# Patient Record
Sex: Male | Born: 1975 | Hispanic: Yes | Marital: Married | Smoking: Never smoker
Health system: Southern US, Community
[De-identification: ages and names within clinical notes are randomized; demographics above are authoritative.]

## PROBLEM LIST (undated history)

## (undated) DIAGNOSIS — J40 Bronchitis, not specified as acute or chronic: Secondary | ICD-10-CM

## (undated) DIAGNOSIS — J302 Other seasonal allergic rhinitis: Secondary | ICD-10-CM

## (undated) DIAGNOSIS — J45909 Unspecified asthma, uncomplicated: Secondary | ICD-10-CM

## (undated) HISTORY — PX: APPENDECTOMY: SHX54

## (undated) HISTORY — DX: Unspecified asthma, uncomplicated: J45.909

---

## 1998-03-24 ENCOUNTER — Inpatient Hospital Stay (HOSPITAL_COMMUNITY): Admission: EM | Admit: 1998-03-24 | Discharge: 1998-03-26 | Payer: Self-pay | Admitting: Emergency Medicine

## 2000-12-13 ENCOUNTER — Encounter: Payer: Self-pay | Admitting: Emergency Medicine

## 2000-12-13 ENCOUNTER — Emergency Department (HOSPITAL_COMMUNITY): Admission: EM | Admit: 2000-12-13 | Discharge: 2000-12-13 | Payer: Self-pay | Admitting: *Deleted

## 2000-12-21 ENCOUNTER — Emergency Department (HOSPITAL_COMMUNITY): Admission: EM | Admit: 2000-12-21 | Discharge: 2000-12-21 | Payer: Self-pay | Admitting: Emergency Medicine

## 2008-02-02 ENCOUNTER — Ambulatory Visit: Payer: Self-pay | Admitting: Internal Medicine

## 2008-02-02 DIAGNOSIS — J309 Allergic rhinitis, unspecified: Secondary | ICD-10-CM | POA: Insufficient documentation

## 2011-07-18 ENCOUNTER — Ambulatory Visit: Payer: Self-pay | Admitting: Family Medicine

## 2011-07-18 VITALS — BP 121/75 | HR 69 | Temp 98.1°F | Resp 16 | Ht 68.0 in | Wt 194.0 lb

## 2011-07-18 DIAGNOSIS — R079 Chest pain, unspecified: Secondary | ICD-10-CM

## 2011-07-18 DIAGNOSIS — R0789 Other chest pain: Secondary | ICD-10-CM

## 2011-07-18 NOTE — Progress Notes (Signed)
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  The pain is mild and only significant with sneeze, not deep breath or cough.  No SOB.    O:  NAD Chest clear with ecchymosis x 4 cm right axilla.  Minimal tenderness. No other signs of trauma Heart:  Reg, no murmur  A:  Contusion right chest  P:  meloxicam 7.5 qd x 10 days.  rtc for worsening sx

## 2013-06-22 ENCOUNTER — Ambulatory Visit: Payer: Self-pay | Admitting: Family Medicine

## 2013-06-22 VITALS — BP 120/72 | HR 80 | Temp 97.5°F | Resp 18 | Ht 67.5 in | Wt 198.0 lb

## 2013-06-22 DIAGNOSIS — R112 Nausea with vomiting, unspecified: Secondary | ICD-10-CM

## 2013-06-22 DIAGNOSIS — K529 Noninfective gastroenteritis and colitis, unspecified: Secondary | ICD-10-CM

## 2013-06-22 DIAGNOSIS — R197 Diarrhea, unspecified: Secondary | ICD-10-CM

## 2013-06-22 DIAGNOSIS — K5289 Other specified noninfective gastroenteritis and colitis: Secondary | ICD-10-CM

## 2013-06-22 MED ORDER — ONDANSETRON 4 MG PO TBDP: 4.0000 mg | ORAL_TABLET | Freq: Three times a day (TID) | ORAL | Status: DC | PRN

## 2013-06-22 NOTE — Patient Instructions (Signed)
Drink plenty of fluids  Take Zofran one pill every 4-6 hours if needed for nausea and vomiting. You do not need to swallow the pill, and can just hold it in your mouth under the tongue until it dissolves.  Take over-the-counter Imodium (loperamide) as directed on the box if you continue having diarrhea  If you develop severe abdominal pain or high fevers either return or go to the emergency room  Take Tylenol (acetaminophen) or ibuprofen if needed for headache  This is not the typical influenza. I think this is an intestinal virus.  Gastroenteritis viral (Viral Gastroenteritis) La gastroenteritis viral tambin es conocida como gripe del Endicottestmago. Este trastorno Performance Food Groupafecta el estmago y el tubo digestivo. Puede causar diarrea y vmitos repentinos. La enfermedad generalmente dura entre 3 y [address] das. La Harley-Davidsonmayora de las personas desarrolla una respuesta inmunolgica. Con el tiempo, esto elimina el virus. Mientras se desarrolla esta respuesta natural, el virus puede afectar en forma importante su salud.  CAUSAS Muchos virus diferentes pueden causar gastroenteritis, por ejemplo el rotavirus o el norovirus. Estos virus pueden contagiarse al consumir alimentos o agua contaminados. Tambin puede contagiarse al compartir utensilios u otros artculos personales con una persona infectada o al tocar una superficie contaminada.  SNTOMAS Los sntomas ms comunes son diarrea y vmitos. Estos problemas pueden causar una prdida grave de lquidos corporales(deshidratacin) y un desequilibrio de sales corporales(electrolitos). Otros sntomas pueden ser:   Grant RutsFiebre.  Dolor de Turkmenistancabeza.  Fatiga.  Dolor abdominal. DIAGNSTICO  El mdico podr hacer el diagnstico de gastroenteritis viral basndose en los sntomas y el examen fsico Tambin pueden tomarle una muestra de materia fecal para diagnosticar la presencia de virus u otras infecciones.  TRATAMIENTO Esta enfermedad generalmente desaparece sin tratamiento. Los  tratamientos estn dirigidos a Social research officer, governmentla rehidratacin. Los casos ms graves de gastroenteritis viral implican vmitos tan intensos que no es posible retener lquidos. En Franklin Resourcesestos casos, los lquidos deben administrarse a travs de una va intravenosa (IV).  INSTRUCCIONES PARA EL CUIDADO DOMICILIARIO  Beba suficientes lquidos para mantener la orina clara o de color amarillo plido. Beba pequeas cantidades de lquido con frecuencia y aumente la cantidad segn la tolerancia.  Pida instrucciones especficas a su mdico con respecto a la rehidratacin.  Evite:  Alimentos que Nurse, adulttengan mucha azcar.  Alcohol.  Gaseosas.  TabacoVista Lawman.  Jugos.  Bebidas con cafena.  Lquidos muy calientes o fros.  Alimentos muy grasos.  Comer demasiado a Licensed conveyancerla vez.  Productos lcteos hasta 24 a 48 horas despus de que se detenga la diarrea.  Puede consumir probiticos. Los probiticos son cultivos activos de bacterias beneficiosas. Pueden disminuir la cantidad y el nmero de deposiciones diarreicas en el adulto. Se encuentran en los yogures con cultivos activos y en los suplementos.  Lave bien sus manos para evitar que se disemine el virus.  Slo tome medicamentos de venta libre o recetados para Primary school teachercalmar el dolor, las molestias o bajar la fiebre segn las indicaciones de su mdico. No administre aspirina a los nios. Los medicamentos antidiarreicos no son recomendables.  Consulte a su mdico si puede seguir tomando sus medicamentos recetados o de H. J. Heinzventa libre.  Cumpla con todas las visitas de control, segn le indique su mdico. SOLICITE ATENCIN MDICA DE INMEDIATO SI:  No puede retener lquidos.  No hay emisin de orina durante 6 a 8 horas.  Le falta el aire.  Observa sangre en el vmito (se ve como caf molido) o en la materia fecal.  Siente dolor abdominal que empeora o se  concentra en una zona pequea (se localiza).  Tiene nuseas o vmitos persistentes.  Tiene fiebre.  El paciente es un nio menor de  3 meses y [place].  El paciente es un nio mayor de 3 meses, tiene fiebre y sntomas persistentes.  El paciente es un nio mayor de 3 meses y tiene fiebre y sntomas que empeoran repentinamente.  El paciente es un beb y no tiene lgrimas cuando llora. ASEGRESE QUE:   Comprende estas instrucciones.  Controlar su enfermedad.  Solicitar ayuda inmediatamente si no mejora o si empeora. Document Released: 04/23/2005 Document Revised: 07/16/2011 [facility] Patient Information 2014 [place], [place].

## 2013-06-22 NOTE — Progress Notes (Signed)
Subjective: The patient woke up this morning with a headache and nausea and has had diarrhea. He vomited a number of times. He feels bad and washed out. No major abdominal pain. No fever.  Objective: In no acute distress. He looks a little ill. His throat is clear. Neck supple without nodes. Chest clear. Heart regular without murmurs. And soft without masses or tenderness.  Assessment: Gastroenteritis Vomiting Nausea Diarrhea  Plan: Zofran Imodium if needed Return if needed or go to emergency room in the event of acute abdominal pain

## 2014-03-19 ENCOUNTER — Encounter (HOSPITAL_COMMUNITY): Payer: Self-pay | Admitting: Family Medicine

## 2014-03-19 ENCOUNTER — Emergency Department (HOSPITAL_COMMUNITY): Payer: Self-pay

## 2014-03-19 ENCOUNTER — Emergency Department (HOSPITAL_COMMUNITY)
Admission: EM | Admit: 2014-03-19 | Discharge: 2014-03-19 | Disposition: A | Discharge status: Home / Self Care | Payer: Self-pay | Attending: Emergency Medicine | Admitting: Emergency Medicine

## 2014-03-19 DIAGNOSIS — R0682 Tachypnea, not elsewhere classified: Secondary | ICD-10-CM | POA: Insufficient documentation

## 2014-03-19 DIAGNOSIS — R05 Cough: Secondary | ICD-10-CM

## 2014-03-19 DIAGNOSIS — J209 Acute bronchitis, unspecified: Secondary | ICD-10-CM | POA: Insufficient documentation

## 2014-03-19 DIAGNOSIS — Z79899 Other long term (current) drug therapy: Secondary | ICD-10-CM | POA: Insufficient documentation

## 2014-03-19 DIAGNOSIS — R0602 Shortness of breath: Secondary | ICD-10-CM

## 2014-03-19 DIAGNOSIS — J40 Bronchitis, not specified as acute or chronic: Secondary | ICD-10-CM

## 2014-03-19 DIAGNOSIS — R059 Cough, unspecified: Secondary | ICD-10-CM

## 2014-03-19 LAB — BASIC METABOLIC PANEL
Anion gap: 14 (ref 5–15)
BUN: 10 mg/dL (ref 6–23)
CO2: 22 meq/L (ref 19–32)
Calcium: 9 mg/dL (ref 8.4–10.5)
Chloride: 104 mEq/L (ref 96–112)
Creatinine, Ser: 0.92 mg/dL (ref 0.50–1.35)
GFR calc non Af Amer: >90 mL/min (ref >90)
GLUCOSE: 121 mg/dL — AB (ref 70–99)
POTASSIUM: 3.7 meq/L (ref 3.7–5.3)
Sodium: 140 mEq/L (ref 137–147)

## 2014-03-19 LAB — CBC WITH DIFFERENTIAL/PLATELET
Basophils Absolute: 0.1 10*3/uL (ref 0.0–0.1)
Basophils Relative: 1 % (ref 0–1)
Eosinophils Absolute: 1.9 10*3/uL — ABNORMAL HIGH (ref 0.0–0.7)
Eosinophils Relative: 14 % — ABNORMAL HIGH (ref 0–5)
HEMATOCRIT: 45.2 % (ref 39.0–52.0)
HEMOGLOBIN: 15.8 g/dL (ref 13.0–17.0)
LYMPHS ABS: 2.7 10*3/uL (ref 0.7–4.0)
Lymphocytes Relative: 21 % (ref 12–46)
MCH: 29.8 pg (ref 26.0–34.0)
MCHC: 35 g/dL (ref 30.0–36.0)
MCV: 85.1 fL (ref 78.0–100.0)
MONO ABS: 0.7 10*3/uL (ref 0.1–1.0)
MONOS PCT: 5 % (ref 3–12)
NEUTROS ABS: 7.9 10*3/uL — AB (ref 1.7–7.7)
Neutrophils Relative %: 59 % (ref 43–77)
Platelets: 239 10*3/uL (ref 150–400)
RBC: 5.31 MIL/uL (ref 4.22–5.81)
RDW: 12.9 % (ref 11.5–15.5)
WBC: 13.3 10*3/uL — ABNORMAL HIGH (ref 4.0–10.5)

## 2014-03-19 MED ORDER — ALBUTEROL SULFATE HFA 108 (90 BASE) MCG/ACT IN AERS
6.0000 | INHALATION_SPRAY | Freq: Once | RESPIRATORY_TRACT | Status: AC
  Administered 2014-03-19: 6 via RESPIRATORY_TRACT
  Filled 2014-03-19: qty 6.7

## 2014-03-19 MED ORDER — ACETAMINOPHEN 500 MG PO TABS
1000.0000 mg | ORAL_TABLET | Freq: Once | ORAL | Status: AC
  Administered 2014-03-19: 1000 mg via ORAL
  Filled 2014-03-19: qty 2

## 2014-03-19 MED ORDER — ALBUTEROL (5 MG/ML) CONTINUOUS INHALATION SOLN
10.0000 mg/h | INHALATION_SOLUTION | Freq: Once | RESPIRATORY_TRACT | Status: AC
  Administered 2014-03-19: 10 mg/h via RESPIRATORY_TRACT
  Filled 2014-03-19: qty 20

## 2014-03-19 MED ORDER — SODIUM CHLORIDE 0.9 % IV BOLUS (SEPSIS)
1000.0000 mL | Freq: Once | INTRAVENOUS | Status: AC
  Administered 2014-03-19: 1000 mL via INTRAVENOUS

## 2014-03-19 MED ORDER — ALBUTEROL SULFATE (2.5 MG/3ML) 0.083% IN NEBU
5.0000 mg | INHALATION_SOLUTION | Freq: Once | RESPIRATORY_TRACT | Status: AC
  Administered 2014-03-19: 5 mg via RESPIRATORY_TRACT
  Filled 2014-03-19: qty 6

## 2014-03-19 MED ORDER — ALBUTEROL SULFATE HFA 108 (90 BASE) MCG/ACT IN AERS: 4.0000 | INHALATION_SPRAY | Freq: Four times a day (QID) | RESPIRATORY_TRACT | Status: DC | PRN

## 2014-03-19 MED ORDER — DEXAMETHASONE 4 MG PO TABS: 10.0000 mg | ORAL_TABLET | Freq: Once | ORAL | Status: DC

## 2014-03-19 MED ORDER — ALBUTEROL SULFATE (2.5 MG/3ML) 0.083% IN NEBU: 5.0000 mg | INHALATION_SOLUTION | Freq: Once | RESPIRATORY_TRACT | Status: AC

## 2014-03-19 MED ORDER — IPRATROPIUM-ALBUTEROL 0.5-2.5 (3) MG/3ML IN SOLN
3.0000 mL | Freq: Once | RESPIRATORY_TRACT | Status: AC
  Administered 2014-03-19: 3 mL via RESPIRATORY_TRACT
  Filled 2014-03-19: qty 3

## 2014-03-19 NOTE — ED Provider Notes (Signed)
CSN: [id number]     Arrival date & time 03/19/14  1530 History   First MD Initiated Contact with Patient 03/19/14 1728     Chief Complaint  Patient presents with  . Shortness of Breath     (Consider location/radiation/quality/duration/timing/severity/associated sxs/prior Treatment) Patient is a 38 y.o. male presenting with shortness of breath. The history is provided by the patient. No language interpreter was used.  Shortness of Breath Severity:  Moderate Onset quality:  Gradual Duration:  1 week Timing:  Constant Progression:  Worsening Chronicity:  Recurrent Context: URI   Relieved by:  Inhaler Worsened by:  Coughing Ineffective treatments:  None tried Associated symptoms: cough   Associated symptoms: no abdominal pain, no chest pain, no fever, no headaches, no rash, no sore throat and no vomiting   Risk factors: no family hx of DVT, no hx of PE/DVT, no prolonged immobilization and no tobacco use     History reviewed. No pertinent past medical history. Past Surgical History  Procedure Laterality Date  . Appendectomy     Family History  Problem Relation Age of Onset  . Diabetes Mother   . Diabetes Father    History  Substance Use Topics  . Smoking status: Passive Smoke Exposure - Never Smoker    Types: Cigarettes  . Smokeless tobacco: Not on file  . Alcohol Use: Not on file    Review of Systems  Constitutional: Negative for fever.  HENT: Negative for congestion, rhinorrhea and sore throat.   Respiratory: Positive for cough and shortness of breath.   Cardiovascular: Negative for chest pain.  Gastrointestinal: Negative for nausea, vomiting, abdominal pain and diarrhea.  Genitourinary: Negative for dysuria and hematuria.  Skin: Negative for rash.  Neurological: Negative for syncope, light-headedness and headaches.  All other systems reviewed and are negative.     Allergies  Review of patient's allergies indicates no known allergies.  Home Medications    Prior to Admission medications   Medication Sig Start Date End Date Taking? Authorizing Provider  albuterol (PROVENTIL) (2.5 MG/3ML) 0.083% nebulizer solution Take 2.5 mg by nebulization every 6 (six) hours as needed for wheezing or shortness of breath.  03/14/14  Yes Historical Provider, MD  ibuprofen (ADVIL,MOTRIN) 200 MG tablet Take 200 mg by mouth every 6 (six) hours as needed for headache or mild pain.    Yes Historical Provider, MD  PROAIR HFA 108 (90 BASE) MCG/ACT inhaler Inhale 2 puffs into the lungs every 6 (six) hours as needed for wheezing or shortness of breath.  03/05/14  Yes Historical Provider, MD  amoxicillin-clavulanate (AUGMENTIN) 875-125 MG per tablet Take 1 tablet by mouth 2 (two) times daily. 03/03/14   Historical Provider, MD  ondansetron (ZOFRAN ODT) 4 MG disintegrating tablet Take 1 tablet (4 mg total) by mouth every 8 (eight) hours as needed for nausea or vomiting. Patient not taking: Reported on 03/19/2014 06/22/13   Peyton Najjaravid H Hopper, MD  theophylline (THEODUR) 100 MG 12 hr tablet Take 100 mg by mouth 3 (three) times daily. 03/15/14   Historical Provider, MD   BP 141/94 mmHg  Pulse 113  Temp(Src) 98 F (36.7 C) (Oral)  Resp 20  Ht 5\' 7"  (1.702 m)  Wt 195 lb (88.451 kg)  BMI 30.53 kg/m2  SpO2 100% Physical Exam  Constitutional: He is oriented to person, place, and time. He appears well-developed and well-nourished.  HENT:  Head: Normocephalic and atraumatic.  Right Ear: External ear normal.  Left Ear: External ear normal.  Eyes: EOM are  normal.  Neck: Normal range of motion. Neck supple.  Cardiovascular: Normal rate, regular rhythm and intact distal pulses.  Exam reveals no gallop and no friction rub.   No murmur heard. Pulmonary/Chest: Effort normal. No respiratory distress. He has wheezes (expiratory). He has no rales. He exhibits no tenderness.  tachypnea  Abdominal: Soft. Bowel sounds are normal. He exhibits no distension. There is no tenderness. There is no  rebound.  Musculoskeletal: Normal range of motion. He exhibits no edema or tenderness.  Lymphadenopathy:    He has no cervical adenopathy.  Neurological: He is alert and oriented to person, place, and time.  Skin: Skin is warm. No rash noted.  Psychiatric: He has a normal mood and affect. His behavior is normal.  Nursing note and vitals reviewed.   ED Course  Procedures (including critical care time) Labs Review Labs Reviewed  CBC WITH DIFFERENTIAL - Abnormal; Notable for the following:    WBC 13.3 (*)    Neutro Abs 7.9 (*)    Eosinophils Relative 14 (*)    Eosinophils Absolute 1.9 (*)    All other components within normal limits  BASIC METABOLIC PANEL - Abnormal; Notable for the following:    Glucose, Bld 121 (*)    All other components within normal limits    Imaging Review Dg Chest 2 View  03/19/2014   CLINICAL DATA:  Shortness of breath, productive cough  EXAM: CHEST  2 VIEW  COMPARISON:  None.  FINDINGS: Cardiomediastinal silhouette is unremarkable. No acute infiltrate or pleural effusion. No pulmonary edema. Bony thorax is unremarkable.  IMPRESSION: No active cardiopulmonary disease.   Electronically Signed   By: Natasha Mead M.D.   On: 03/19/2014 18:02     EKG Interpretation None      MDM   Final diagnoses:  Bronchitis  Cough    5:28 PM Pt is a 38 y.o. male with no pertinent PMHX who presents to the ED with shortness of breath. Seen at an urgent care 10/29 and diagnosed with pneumonia. Started on inhaler, augmentin, prednisone. Continues to have cough. Worsening shortness of breath over the past week. No immobilization. No previous history of TB. No sick contacts. No out of country travel. No fevers. No nausea, vomiting or diarrhea. Did not get flu shot. Endorses cough productive of yellow sputum. No history of immunosuppression. No personal or family history of asthma. Orthopnea or PND  No long distance travel. Low suspicion for DVt/PE. No chest pain, non pleuritic.  Concern for possible pneumonia versus possible bronchitis. Plan fopr CXR and albuterol and screening labs  EKG personally reviewed by myself showed sionustachycardia Rate of 113, PR , QRS 92ms QT/QTC 324/473ms, left axis, without evidence of new ischemia. No Comparison, indication: shortness of breath  Review of labs: CBc: leuikocytosis, H&H 15.8/45.2 BMP: no electrolyte abnormalities  CXR PA/LAT per my read for shortness of breath showed no focal consolidation, no ptx. After albuterol still  With tachypnea will trial duoneb.  6:51 PM still with inspiratory and expiratory wheezes after duoneb. Plan for continous albuterol  9:45PM feeling better. Scattered wheezes with improved aeration. Will ambulate and check pulse ox.   10:13 PM ambulated without desaturation. Improvement in shortness of breath. Plan for discharge. Patient likey with bronchitis. Will have RT teafch patient how to use albuterol inhaler with spacer  Patient to follow up with [place] [facility] in 1 week for follow up. Script for albuterol given. Strict return precautions given  10:29 PM:  I have discussed  the diagnosis/risks/treatment options with the patient and believe the pt to be eligible for discharge home [facility]. We also discussed returning to the ED immediately if new or worsening sx occur. We discussed the sx which are most concerning (e.g., worsening symptoms) that necessitate immediate return. Any new prescriptions provided to the patient are listed below.   Discharge Medication List as of 03/19/2014 10:29 PM      The patient appears reasonably screened and/or stabilized for discharge and I doubt any other medical condition or other [facility] requiring further screening, evaluation or treatment in the ED at this time prior to discharge . Pt in agreement with discharge plan. Return precautions given. Pt discharged VSS  Labs, EKG and imaging reviewed by myself and considered  in medical decision making if ordered.  Imaging interpreted by radiology. Pt was discussed with my attending, Dr. Elmer SowJacubowitz     Bob Eastwood Peter Steffie Waggoner, MD 03/20/14 [id number]  Doug SouSam Jacubowitz, MD 03/20/14 1444

## 2014-03-19 NOTE — ED Notes (Signed)
Pt with increased labored breathing and coughing. Dr Sherri SearJacubawitz notified.

## 2014-03-19 NOTE — ED Notes (Addendum)
Pt presents from home with c/o shortness of breath. Pt was diagnosed with Pneumonia on March 04, 2014 and was given PO antibiotics, steroids, and an inhaler. Pt reports his shortness of breath has not improved over the last few weeks.  Pt is A&Ox4. Interpreter line used to triage patient, Spanish is primary language. Pt used 2.5mg  Albuterol breathing treatment 2 hours ago.

## 2014-03-19 NOTE — ED Notes (Signed)
Respiratory called for continuous neb 

## 2014-03-19 NOTE — Discharge Instructions (Signed)
1. See [place] and wellness in 1 week for recheck 2. Come back if worsening symptoms Cmo usar Educational psychologist (How to Use an Inhaler) Es muy importante que sepa usar su [place]. Una buena tcnica garantizar que el medicamento llegue a los pulmones.  CMO USAR UN INHALADOR:  Retire la tapa del Tax inspector.  Si esta es la primera vez que [place] el [place], debe prepararlo. Sacuda el inhalador durante 5segundos. Libere cuatro descargas en el aire, lejos del rostro. Si tiene preguntas, pdale ayuda al mdico.  Sacuda el inhalador durante 5segundos.  Gire el inhalador de modo que la botella quede por encima de la boquilla.  Coloque el dedo ndice por encima de la botella. El pulgar debe sujetar la parte inferior del inhalador.  Abra la boca.  Sostenga el inhalador lejos de la boca (un ancho de 2 dedos) o coloque los labios alrededor de la boquilla. Pregntele al mdico de qu manera debe usar Forensic psychologist.  Exhale la mayor cantidad de aire que pueda.  Inhale y presione la botella hacia abajo 1vez para liberar el medicamento. Sentir cmo el medicamento ingresa a la boca y Patent examiner.  Siga inspirando profundamente, muy despacio. Trate de llenar los pulmones.  Despus de que haya inspirado profundamente, contenga la respiracin durante 10segundos. Esto ayudar a que el medicamento se asiente en los pulmones. Si no puede contener la respiracin durante 10segundos, contngala cuanto ms pueda antes de exhalar.  Exhale lentamente a travs de los labios fruncidos. Ponga los labios como cuando silba.  Si el mdico le ha indicado que debe aspirar ms de 1 descarga, espere de 15 a 30segundos como mnimo entre Counsellor. Esto ayudar a que [employer] del [place]. No use el inhalador ms veces de las que el [employer].  Vuelva a Chiropractor.  Siga las indicaciones del mdico o las instrucciones que vienen en la caja  para Air cabin crew. Si [place] ms de Educational psychologist, pregntele al mdico qu inhaladores debe usar y en qu orden. Pdale al mdico que lo ayude a determinar cundo Health and safety inspector a Biomedical engineer.  Si [place] un inhalador con corticoides, enjuguese siempre la boca con agua despus de la ltima descarga, hgase grgaras y escupa el agua. No trague el agua. SOLICITE AYUDA SI:  El medicamento del Tax inspector solo lo ayuda parcialmente a Scientist, water quality las sibilancias y las dificultades para Ambulance person.  Tiene dificultad para Water quality scientist.  Experimenta un leve aumento de la expectoracin espesa (flema). SOLICITE AYUDA DE INMEDIATO SI:  El medicamento del inhalador no ayuda a Scientist, water quality las sibilancias o la dificultad para respirar, o bien, si siente opresin en el pecho.  Siente mareos, dolor de cabeza o una frecuencia cardaca acelerada.  Siente escalofros, fiebre o sudores nocturnos.  Experimenta un aumento considerable de la expectoracin espesa o si observa sangre en dicha expectoracin espesa. ASEGRESE DE QUE:   Comprende estas instrucciones.  Controlar su afeccin.  Recibir ayuda de inmediato si no mejora o si empeora. Document Released: 05/26/2010 Document Revised: 02/11/2013 [facility] Patient Information 2015 [place]. This information is not intended to replace advice given to you by your health care provider. Make sure you discuss any questions you have with your health care provider.  Inhalador con medidor de dosis con Geographical information systems officer (Metered Dose Inhaler with Spacer) Los medicamentos que se inhalan son la base del tratamiento del asma y otros problemas respiratorios. Los medicamentos inhalatorios son eficaces cuando se  utilizan adecuadamente. Una buena tcnica garantiza que el medicamento llegue a los pulmones. El mdico le ha pedido que use un espaciador con Forensic psychologist para ayudarlo a que reciba el medicamento ms eficazmente. Un espaciador es un tubo plstico con una  boquilla en un extremo y Ardelia Mems abertura que lo conecta al inhalador en el otro extremo. Inhalador con medidor de dosis se [place] para Architectural technologist varios tipos de medicamentos inhalados. Entre ellos se incluyen los medicamentos de alivio rpido o medicamentos de rescate (como los broncodilatadores) y los medicamentos de control (como los corticoides). El medicamento se administra presionando el contenedor metlico para Financial controller una cantidad de aerosol. Si [place] diferentes tipos de inhaladores, use el medicamento de alivio rpido para abrir las vas respiratorias 10 a 3minutos antes de usar un corticoide, si el mdico se lo indic. Si no est seguro de Midwife y le indicaron usarlo, consulte a su mdico, enfermera o terapeuta en vas respiratorias. CMO USAR EL INHALADOR CON ESPACIADOR  Retire la tapa del inhalador.  Si [place] el inhalador por primera vez, ser necesario que lo prepare. Sacuda el inhalador durante 5 segundos y libere cuatro descargas en el aire, lejos del [place]. Consulte a su mdico o farmacutico si tiene preguntas acerca de la preparacin del inhalador.  Sacuda el inhalador durante 5segundos antes de cada inspiracin (inhalacin).  Coloque el extremo abierto del Geographical information systems officer en la boquilla del Tax inspector.  Coloque el inhalador de modo que la parte superior del envase quede hacia arriba y la boquilla del espaciador delante suyo.  Coloque el dedo ndice en la parte superior del envase del medicamento. El pulgar sostiene la parte inferior del inhalador y Arts development officer.  Espire (exhale) normalmente y lo ms profundamente posible.  Inmediatamente despus de exhalar, coloque el espaciador entre sus dientes y [place] de su boca. Cierre fuertemente la boca alrededor del Geographical information systems officer.  Presione el envase hacia abajo con el dedo ndice para Product/process development scientist.  Al mismo tiempo que presiona el envase, inhale profunda y lentamente hasta que los pulmones se llenen completamente. Esto  debe llevarle de 4 a 6segundos. Mantenga la lengua abajo y [place] del [place].  Mantenga el medicamento en los pulmones entre 5 y 10segundos (10segundos es lo ms indicado). Esto ayuda a que el medicamento ingrese en las vas respiratorias pequeas y en los pulmones. Exhale.  Repita la inhalacin profundamente a travs de la boquilla del Geographical information systems officer. Contenga la respiracin por lo menos 10 segundos (10 segundos es lo ms indicado). Exhale lentamente. Si le resulta difcil tomar esta segunda inhalacin profunda a travs del espaciador, respire normalmente varias veces a travs del mismo. Retire el espaciador de su boca.  Entre cada descarga, espere de 15 a 30 segundos, como mnimo. Contine con los pasos indicados anteriormente hasta que haya tomado el nmero de descargas que el mdico le indic. No use el inhalador ms de lo que su mdico le indique.  Retire Arts development officer del Tax inspector y colquele la tapa al Tax inspector.  Siga las indicaciones de su mdico o las instrucciones del manual para Air cabin crew y Arts development officer. Si utiliza Educational psychologist con corticoides, enjuague su boca con agua despus de la ltima descarga, hgase grgaras y escupa el agua. No trague el agua. EVITE LO SIGUIENTE:  Inhalar antes o despus de comenzar el aerosol del medicamento. Es necesario tener prctica para coordinar la respiracin con la emisin del aerosol.  Al emitir el aerosol, inhale por la nariz (ms que por la boca). [place]  SABER SI EL INHALADOR EST LLENO O VACO No puede saber cundo un inhalador est vaco al sacudirlo. Algunos inhaladores ahora vienen con contador de dosis. Pdale a su mdico que le recete el que tiene el contador de dosis si siente que necesita ayuda extra. Si el inhalador no tiene Scientist, physiological, pdale a su mdico que lo ayude a Soil scientist a llenarlo. Anote la fecha en que deber rellenarlo en un almanaque o en el envase del inhalador. Rellene el inhalador  de 7 a 10das antes de que se termine. Asegrese de Theatre manager un adecuado suministro del medicamento. Esto incluye asegurarse de que no est vencido y de que tiene un inhalador de repuesto.  SOLICITE ATENCIN MDICA SI:   Los sntomas solo se Engineer, mining con Forensic psychologist.  Tiene dificultad para Water quality scientist.  Aumenta la produccin de flema. SOLICITE ATENCIN MDICA DE INMEDIATO SI:   Siente poco alivio con el uso de los [place], o no tiene [place]. [place] con sibilancias y siente que le falta el aire, tiene opresin en el [place], o ambos.  Siente mareos, dolor de cabeza o una frecuencia cardaca acelerada.  Siente escalofros, fiebre o sudores nocturnos.  Nota un aumento en la produccin de flema o hay sangre en la flema. Document Released: 07/31/2007 Document Revised: 09/07/2013 [facility] Patient Information 2015 [place], [place]. This information is not intended to replace advice given to you by your health care provider. Make sure you discuss any questions you have with your health care provider.  Infeccin de las vas areas superiores en los adultos (Upper Respiratory Infection, Adult)  La infeccin de las vas areas superiores tambin se conoce como resfro comn. La causa es un tipo de germen (virus). Los resfros se diseminan fcilmente (son contagiosos). Puede transmitirlo a los dems al besar, toser, estornudar o beber en el mismo vaso. Generalmente se cura en 1 a 2 semanas.  [place] medicacin segn las indicaciones.  Use un humidificador caliente o respire el vapor en una ducha caliente.  Beba gran cantidad de lquido para mantener la orina de tono claro o color amarillo plido.  Debe hacer reposo.  Regrese a su trabajo cuando la temperatura se haya normalizado, o cuando el mdico se lo indique. Puede usar un barbijo y Insurance risk surveyor las manos para evitar que el resfro se contagie. SOLICITE AYUDA DE INMEDIATO SI:   Luego de los  primeros das siente que empeora en vez de [place].  Tiene dudas relacionadas con los medicamentos.  Siente escalofros, le falta el aire o escupe moco de color marrn o rojo.  Tiene una secrecin nasal de color amarillo o marrn, o siente dolor en el rostro, especialmente cuando se inclina hacia adelante.  Tiene fiebre, siente el cuello hinchado, tiene dolor al tragar u observa manchas blancas en el fondo de la garganta.  Comienza a sentir Clinical cytogeneticist de cabeza intenso o persistente, dolor de odos, en el seno nasal o en el pecho.  Al respirar emite un sonido similar a un silbido (sibilancias).  Siente falta de aire o escupe sangre al toser.  Tiene dolores musculares o rigidez en el cuello. ASEGRESE DE QUE:   Comprende estas instrucciones.  Controlar su enfermedad.  Solicitar ayuda de inmediato si no mejora o si empeora. Document Released: 09/25/2010 Document Revised: 07/16/2011 [facility] Patient Information 2015 [place]. This information is not intended to replace advice given to you by your health care provider. Make sure  you discuss any questions you have with your health care provider.

## 2014-03-19 NOTE — ED Provider Notes (Signed)
History is obtained from medical interpreter using Pacific language line. Patient's English is limitedComplains of cough productive of yellow sputum and shortness of breath for approximately 1.5 weekson exam mild to moderate respiratory distress speaks in sentences. Lungs expiratory wheezes, tachypnea, frequent coughing  Doug SouSam Hartwell Vandiver, MD 03/20/14 1443

## 2014-03-20 ENCOUNTER — Encounter (HOSPITAL_COMMUNITY): Payer: Self-pay | Admitting: *Deleted

## 2014-03-20 ENCOUNTER — Emergency Department (HOSPITAL_COMMUNITY)
Admission: EM | Admit: 2014-03-20 | Discharge: 2014-03-20 | Disposition: A | Discharge status: Home / Self Care | Payer: Self-pay | Attending: Emergency Medicine | Admitting: Emergency Medicine

## 2014-03-20 DIAGNOSIS — R062 Wheezing: Secondary | ICD-10-CM

## 2014-03-20 DIAGNOSIS — Z792 Long term (current) use of antibiotics: Secondary | ICD-10-CM | POA: Insufficient documentation

## 2014-03-20 DIAGNOSIS — M549 Dorsalgia, unspecified: Secondary | ICD-10-CM | POA: Insufficient documentation

## 2014-03-20 DIAGNOSIS — Z79899 Other long term (current) drug therapy: Secondary | ICD-10-CM | POA: Insufficient documentation

## 2014-03-20 DIAGNOSIS — R05 Cough: Secondary | ICD-10-CM

## 2014-03-20 DIAGNOSIS — J40 Bronchitis, not specified as acute or chronic: Secondary | ICD-10-CM | POA: Insufficient documentation

## 2014-03-20 DIAGNOSIS — R059 Cough, unspecified: Secondary | ICD-10-CM

## 2014-03-20 MED ORDER — IPRATROPIUM-ALBUTEROL 0.5-2.5 (3) MG/3ML IN SOLN
3.0000 mL | Freq: Once | RESPIRATORY_TRACT | Status: AC
  Administered 2014-03-20: 3 mL via RESPIRATORY_TRACT
  Filled 2014-03-20: qty 3

## 2014-03-20 MED ORDER — PREDNISONE 20 MG PO TABS
60.0000 mg | ORAL_TABLET | Freq: Once | ORAL | Status: AC
  Administered 2014-03-20: 60 mg via ORAL
  Filled 2014-03-20: qty 3

## 2014-03-20 MED ORDER — PREDNISONE 20 MG PO TABS
ORAL_TABLET | ORAL | Status: DC
Start: 2014-03-20 — End: 2014-10-17

## 2014-03-20 MED ORDER — BENZONATATE 100 MG PO CAPS: 100.0000 mg | ORAL_CAPSULE | Freq: Three times a day (TID) | ORAL | Status: DC

## 2014-03-20 MED ORDER — ACETAMINOPHEN 500 MG PO TABS
1000.0000 mg | ORAL_TABLET | Freq: Once | ORAL | Status: AC
  Administered 2014-03-20: 1000 mg via ORAL
  Filled 2014-03-20: qty 2

## 2014-03-20 NOTE — Discharge Instructions (Signed)
Return to the emergency room with worsening of symptoms, new symptoms or with symptoms that are concerning, especially fevers, stiff neck, worsening headache, nausea/vomiting, visual changes or slurred speech, chest pain, shortness of breath, cough with thick colored mucous or blood. Drink plenty of fluids with electrolytes especially Gatorade. OTC cold medications such as mucinex, nyquil, dayquil are recommended. Chloraseptic for sore throat. Cough syrup for cough.  Continue to use inhaler for shortness of breath, cough. Prednisone daily. Take in the morning. Follow up with wellness center. Call to make appointment.    Tos - Adultos  (Cough, Adult)  La tos es un reflejo que ayuda a limpiar las vas areas y Administratorla garganta. Puede ayudar a curar el organismo o ser Neomia Dearuna reaccin a un irritante. La tos puede durar General Electricentre 2  3 semanas (aguda) o puede durar ms de 8 semanas (crnica)  CAUSAS  Tos aguda:   Infecciones virales o bacterianas. Tos crnica.   Infecciones.  Alergias.  Asma.  Goteo post nasal.  El hbito de fumar.  Acidez o reflujo gstrico.  Algunos medicamentos.  Problemas pulmonares crnicos  Cncer. SNTOMAS   Tos.  Grant RutsFiebre.  Dolor en el pecho.  Aumento en el ritmo respiratorio.  Ruidos agudos al respirar (sibilancias).  Moco coloreado al toser (esputo). TRATAMIENTO   Un tos de causa bacteriana puede tratarse con antibiticos.  La tos de origen viral debe seguir su curso y no responde a los antibiticos.  El mdico podr recomendar otros tratamientos si tiene tos crnica. INSTRUCCIONES PARA EL CUIDADO EN EL HOGAR   Solo tome medicamentos que se pueden comprar sin receta o recetados para Chief Technology Officerel dolor, Dentistmalestar o fiebre, como le indica el mdico. Utilice antitusivos slo en la forma indicada por el mdico.  Use un vaporizador o humidificador de niebla fra en la habitacin para ayudar a aflojar las secreciones.  Duerma en posicin semi erguida si la tos  empeora por la noche.  Descanse todo lo que pueda.  Si fuma, abandone el hbito. SOLICITE ATENCIN MDICA DE INMEDIATO SI:   Observa pus en el esputo.  La tos empeora.  No puede controlar la tos con antitusivos y no puede dormir debido a Secretary/administratorello.  Comienza a escupir sangre al toser.  Tiene dificultad para respirar.  El dolor empeora o no puede controlarlo con los medicamentos.  Tiene fiebre. ASEGRESE DE QUE:   Comprende estas instrucciones.  Controlar su enfermedad.  Solicitar ayuda de inmediato si no mejora o si empeora. Document Released: 11/29/2010 Document Revised: 07/16/2011 [facility] Patient Information 2015 [place]. This information is not intended to replace advice given to you by your health care provider. Make sure you discuss any questions you have with your health care provider.

## 2014-03-20 NOTE — ED Notes (Signed)
Pt ambulated without difficulty. 95% room air. No signs of distress noted.

## 2014-03-20 NOTE — ED Notes (Signed)
Pt c/o of same symptoms he was evaluated for last night. Pt was discharged from ED around 2300 last night. Pt states when he went home he was not getting any relief from his rescue inhaler. Pt did get relief from breathing treats in ED

## 2014-03-20 NOTE — ED Provider Notes (Signed)
CSN: [id number]     Arrival date & time 03/20/14  0448 History   First MD Initiated Contact with Patient 03/20/14 [phone]     Chief Complaint  Patient presents with  . Cough     (Consider location/radiation/quality/duration/timing/severity/associated sxs/prior Treatment) HPI  Bryan Barr is a 38 y.o. male without significant PMH presenting with return of shortness of breath and productive cough of thin yellow mucus. Nonbloody. Patient was seen in the ED yesterday for the same symptoms. He reports that breathing treatments in the ED helped medicines he got home inhaler did not help. Patient denies fevers, chills. Patient endorses mild sore throat. No nausea, vomiting, diarrhea, abdominal pain. No headaches or chest pain. No rashes. Pt seen at urgent care 10/29 and diagnosed with pneumonia. Started on inhaler, augmentin, prednisone which he has finished. Pt without history of recent surgeries, trauma, history of DVT or PE. No unilateral leg swelling or tenderness. Patient denies history of COPD, asthma. Pt endorses weekly recreational smoking for years. Pt with passive smoke exposure.    History reviewed. No pertinent past medical history. Past Surgical History  Procedure Laterality Date  . Appendectomy     Family History  Problem Relation Age of Onset  . Diabetes Mother   . Diabetes Father    History  Substance Use Topics  . Smoking status: Passive Smoke Exposure - Never Smoker    Types: Cigarettes  . Smokeless tobacco: Not on file  . Alcohol Use: Not on file    Review of Systems  Constitutional: Positive for fever and chills.  HENT: Negative for congestion and rhinorrhea.   Eyes: Negative for visual disturbance.  Respiratory: Positive for cough and shortness of breath.   Cardiovascular: Negative for chest pain and palpitations.  Gastrointestinal: Negative for nausea, vomiting and diarrhea.  Musculoskeletal: Positive for back pain. Negative for gait problem.  Skin: Negative  for rash.  Neurological: Negative for weakness and headaches.      Allergies  Review of patient's allergies indicates no known allergies.  Home Medications   Prior to Admission medications   Medication Sig Start Date End Date Taking? Authorizing Provider  albuterol (PROAIR HFA) 108 (90 BASE) MCG/ACT inhaler Inhale 4-6 puffs into the lungs every 6 (six) hours as needed for wheezing or shortness of breath. 03/19/14   Fredirick LatheAndrew Peter Wong, MD  amoxicillin-clavulanate (AUGMENTIN) 875-125 MG per tablet Take 1 tablet by mouth 2 (two) times daily. 03/03/14   Historical Provider, MD  benzonatate (TESSALON) 100 MG capsule Take 1 capsule (100 mg total) by mouth every 8 (eight) hours. 03/20/14   Louann SjogrenVictoria L Lashala Laser, PA-C  ibuprofen (ADVIL,MOTRIN) 200 MG tablet Take 200 mg by mouth every 6 (six) hours as needed for headache or mild pain.     Historical Provider, MD  ondansetron (ZOFRAN ODT) 4 MG disintegrating tablet Take 1 tablet (4 mg total) by mouth every 8 (eight) hours as needed for nausea or vomiting. Patient not taking: Reported on 03/19/2014 06/22/13   Peyton Najjaravid H Hopper, MD  predniSONE (DELTASONE) 20 MG tablet 2 tabs po daily x 5 days 03/20/14   Louann SjogrenVictoria L Malayiah Mcbrayer, PA-C  theophylline (THEODUR) 100 MG 12 hr tablet Take 100 mg by mouth 3 (three) times daily. 03/15/14   Historical Provider, MD   BP 120/73 mmHg  Pulse 87  Temp(Src) 98.1 F (36.7 C) (Oral)  Resp 20  Ht 5\' 7"  (1.702 m)  Wt 195 lb (88.451 kg)  BMI 30.53 kg/m2  SpO2 97% Physical Exam  Constitutional: He  appears well-developed and well-nourished. No distress.  HENT:  Head: Normocephalic and atraumatic.  Nose: Right sinus exhibits no maxillary sinus tenderness and no frontal sinus tenderness. Left sinus exhibits no maxillary sinus tenderness and no frontal sinus tenderness.  Mouth/Throat: Mucous membranes are normal. Posterior oropharyngeal edema and posterior oropharyngeal erythema present. No oropharyngeal exudate.  Eyes: Conjunctivae  and EOM are normal. Right eye exhibits no discharge. Left eye exhibits no discharge.  Neck: Normal range of motion. Neck supple.  Cardiovascular: Normal rate, regular rhythm and normal heart sounds.   Pulmonary/Chest:  Patient with diffuse wheezes with equal breath sounds bilaterally. No respiratory distress no accessory muscle use. Equal chest expansion. No tachypnea. Pt speaks in complete sentences.  Abdominal: Soft. Bowel sounds are normal. He exhibits no distension. There is no tenderness.  Lymphadenopathy:    He has no cervical adenopathy.  Neurological: He is alert.  Skin: Skin is warm and dry. He is not diaphoretic.  Nursing note and vitals reviewed.   ED Course  Procedures (including critical care time) Labs Review Labs Reviewed - No data to display  Imaging Review Dg Chest 2 View  03/19/2014   CLINICAL DATA:  Shortness of breath, productive cough  EXAM: CHEST  2 VIEW  COMPARISON:  None.  FINDINGS: Cardiomediastinal silhouette is unremarkable. No acute infiltrate or pleural effusion. No pulmonary edema. Bony thorax is unremarkable.  IMPRESSION: No active cardiopulmonary disease.   Electronically Signed   By: Natasha MeadLiviu  Pop M.D.   On: 03/19/2014 18:02     EKG Interpretation None      Meds given in ED:  Medications  ipratropium-albuterol (DUONEB) 0.5-2.5 (3) MG/3ML nebulizer solution 3 mL (3 mLs Nebulization Given 03/20/14 0536)  ipratropium-albuterol (DUONEB) 0.5-2.5 (3) MG/3ML nebulizer solution 3 mL (3 mLs Nebulization Given 03/20/14 0704)  predniSONE (DELTASONE) tablet 60 mg (60 mg Oral Given 03/20/14 0703)  acetaminophen (TYLENOL) tablet 1,000 mg (1,000 mg Oral Given 03/20/14 0704)    Discharge Medication List as of 03/20/2014  9:02 AM    START taking these medications   Details  benzonatate (TESSALON) 100 MG capsule Take 1 capsule (100 mg total) by mouth every 8 (eight) hours., Starting 03/20/2014, Until Discontinued, Print    predniSONE (DELTASONE) 20 MG tablet 2  tabs po daily x 5 days, Print          MDM   Final diagnoses:  Cough  Bronchitis  Wheezing   Pt diagnosed with bronchitis yesterday. Given inhaler and returned due to severe coughing spells. He denies worsening of his breathing symptoms, shortness of breath. No chest pain. Pt also admits to smoking history. Patient ambulated in ED with O2 saturations maintained 95% room air, no signs of respiratory distress. Lung exam improved after 2 nebulizer treatment. Prednisone given in the ED and pt will bd dc with 5 day burst. Pt has been instructed to continue using prescribed medications including cough suppressant and to follow up with PCP or wellness center.   Discussed return precautions with patient. Discussed all results and patient verbalizes understanding and agrees with plan.  Case has been discussed with Dr. Deretha EmoryZackowski who agrees with the above plan and to discharge.        Louann SjogrenVictoria L Jonesha Tsuchiya, PA-C 03/20/14 [id number]  Vanetta MuldersScott Zackowski, MD 03/20/14 [phone]

## 2014-07-13 ENCOUNTER — Ambulatory Visit: Payer: 59 | Admitting: Podiatry

## 2014-10-17 ENCOUNTER — Observation Stay (HOSPITAL_COMMUNITY): Payer: 59

## 2014-10-17 ENCOUNTER — Encounter (HOSPITAL_COMMUNITY): Payer: Self-pay | Admitting: Emergency Medicine

## 2014-10-17 ENCOUNTER — Emergency Department (HOSPITAL_COMMUNITY): Payer: 59

## 2014-10-17 ENCOUNTER — Observation Stay (HOSPITAL_COMMUNITY)
Admission: EM | Admit: 2014-10-17 | Discharge: 2014-10-18 | Disposition: A | Discharge status: Home / Self Care | Payer: 59 | Attending: Internal Medicine | Admitting: Internal Medicine

## 2014-10-17 DIAGNOSIS — J189 Pneumonia, unspecified organism: Secondary | ICD-10-CM | POA: Diagnosis present

## 2014-10-17 DIAGNOSIS — J45901 Unspecified asthma with (acute) exacerbation: Principal | ICD-10-CM | POA: Insufficient documentation

## 2014-10-17 DIAGNOSIS — R000 Tachycardia, unspecified: Secondary | ICD-10-CM | POA: Diagnosis not present

## 2014-10-17 DIAGNOSIS — J208 Acute bronchitis due to other specified organisms: Secondary | ICD-10-CM

## 2014-10-17 DIAGNOSIS — Z87891 Personal history of nicotine dependence: Secondary | ICD-10-CM | POA: Diagnosis not present

## 2014-10-17 DIAGNOSIS — J309 Allergic rhinitis, unspecified: Secondary | ICD-10-CM | POA: Diagnosis present

## 2014-10-17 DIAGNOSIS — R0602 Shortness of breath: Secondary | ICD-10-CM

## 2014-10-17 DIAGNOSIS — Z720 Tobacco use: Secondary | ICD-10-CM | POA: Diagnosis not present

## 2014-10-17 DIAGNOSIS — J301 Allergic rhinitis due to pollen: Secondary | ICD-10-CM

## 2014-10-17 DIAGNOSIS — R918 Other nonspecific abnormal finding of lung field: Secondary | ICD-10-CM | POA: Diagnosis not present

## 2014-10-17 DIAGNOSIS — B9689 Other specified bacterial agents as the cause of diseases classified elsewhere: Secondary | ICD-10-CM

## 2014-10-17 DIAGNOSIS — J45909 Unspecified asthma, uncomplicated: Secondary | ICD-10-CM

## 2014-10-17 DIAGNOSIS — R0600 Dyspnea, unspecified: Secondary | ICD-10-CM

## 2014-10-17 DIAGNOSIS — J209 Acute bronchitis, unspecified: Secondary | ICD-10-CM | POA: Diagnosis not present

## 2014-10-17 HISTORY — DX: Other seasonal allergic rhinitis: J30.2

## 2014-10-17 HISTORY — DX: Bronchitis, not specified as acute or chronic: J40

## 2014-10-17 LAB — CBC WITH DIFFERENTIAL/PLATELET
BASOS ABS: 0.1 10*3/uL (ref 0.0–0.1)
Basophils Relative: 1 % (ref 0–1)
Eosinophils Absolute: 2 10*3/uL — ABNORMAL HIGH (ref 0.0–0.7)
Eosinophils Relative: 21 % — ABNORMAL HIGH (ref 0–5)
HEMATOCRIT: 46.2 % (ref 39.0–52.0)
Hemoglobin: 16.3 g/dL (ref 13.0–17.0)
LYMPHS PCT: 23 % (ref 12–46)
Lymphs Abs: 2.2 10*3/uL (ref 0.7–4.0)
MCH: 30.2 pg (ref 26.0–34.0)
MCHC: 35.3 g/dL (ref 30.0–36.0)
MCV: 85.6 fL (ref 78.0–100.0)
MONO ABS: 0.4 10*3/uL (ref 0.1–1.0)
Monocytes Relative: 4 % (ref 3–12)
NEUTROS ABS: 4.9 10*3/uL (ref 1.7–7.7)
Neutrophils Relative %: 51 % (ref 43–77)
Platelets: 203 10*3/uL (ref 150–400)
RBC: 5.4 MIL/uL (ref 4.22–5.81)
RDW: 13.6 % (ref 11.5–15.5)
WBC: 9.5 10*3/uL (ref 4.0–10.5)

## 2014-10-17 LAB — BASIC METABOLIC PANEL
ANION GAP: 8 (ref 5–15)
BUN: 13 mg/dL (ref 6–20)
CALCIUM: 8.8 mg/dL — AB (ref 8.9–10.3)
CO2: 23 mmol/L (ref 22–32)
Chloride: 108 mmol/L (ref 101–111)
Creatinine, Ser: 0.88 mg/dL (ref 0.61–1.24)
GFR calc Af Amer: >60 mL/min (ref >60)
Glucose, Bld: 125 mg/dL — ABNORMAL HIGH (ref 65–99)
POTASSIUM: 4.1 mmol/L (ref 3.5–5.1)
Sodium: 139 mmol/L (ref 135–145)

## 2014-10-17 LAB — I-STAT CG4 LACTIC ACID, ED: LACTIC ACID, VENOUS: 1.28 mmol/L (ref 0.5–2.0)

## 2014-10-17 LAB — RAPID URINE DRUG SCREEN, HOSP PERFORMED
AMPHETAMINES: NOT DETECTED
Barbiturates: NOT DETECTED
Benzodiazepines: NOT DETECTED
Cocaine: NOT DETECTED
Opiates: NOT DETECTED
TETRAHYDROCANNABINOL: NOT DETECTED

## 2014-10-17 LAB — STREP PNEUMONIAE URINARY ANTIGEN: Strep Pneumo Urinary Antigen: NEGATIVE

## 2014-10-17 LAB — PROCALCITONIN

## 2014-10-17 MED ORDER — LORATADINE 10 MG PO TABS
10.0000 mg | ORAL_TABLET | Freq: Every day | ORAL | Status: DC
  Administered 2014-10-17 – 2014-10-18 (×2): 10 mg via ORAL
  Filled 2014-10-17 (×2): qty 1

## 2014-10-17 MED ORDER — IPRATROPIUM-ALBUTEROL 0.5-2.5 (3) MG/3ML IN SOLN
3.0000 mL | RESPIRATORY_TRACT | Status: DC
  Administered 2014-10-17: 3 mL via RESPIRATORY_TRACT
  Filled 2014-10-17: qty 3

## 2014-10-17 MED ORDER — IPRATROPIUM-ALBUTEROL 0.5-2.5 (3) MG/3ML IN SOLN
3.0000 mL | Freq: Once | RESPIRATORY_TRACT | Status: AC
  Administered 2014-10-17: 3 mL via RESPIRATORY_TRACT
  Filled 2014-10-17: qty 3

## 2014-10-17 MED ORDER — DEXTROSE 5 % IV SOLN: 500.0000 mg | INTRAVENOUS | Status: DC

## 2014-10-17 MED ORDER — CEFTRIAXONE SODIUM 1 G IJ SOLR
1.0000 g | Freq: Once | INTRAMUSCULAR | Status: AC
  Administered 2014-10-17: 1 g via INTRAVENOUS
  Filled 2014-10-17: qty 10

## 2014-10-17 MED ORDER — SODIUM CHLORIDE 0.9 % IV BOLUS (SEPSIS)
1000.0000 mL | Freq: Once | INTRAVENOUS | Status: AC
  Administered 2014-10-17: 1000 mL via INTRAVENOUS

## 2014-10-17 MED ORDER — ACETAMINOPHEN 650 MG RE SUPP: 650.0000 mg | Freq: Four times a day (QID) | RECTAL | Status: DC | PRN

## 2014-10-17 MED ORDER — ALBUTEROL SULFATE (2.5 MG/3ML) 0.083% IN NEBU: 2.5000 mg | INHALATION_SOLUTION | RESPIRATORY_TRACT | Status: DC | PRN

## 2014-10-17 MED ORDER — ENOXAPARIN SODIUM 40 MG/0.4ML ~~LOC~~ SOLN
40.0000 mg | SUBCUTANEOUS | Status: DC
  Administered 2014-10-17: 40 mg via SUBCUTANEOUS
  Filled 2014-10-17 (×2): qty 0.4

## 2014-10-17 MED ORDER — ALBUTEROL (5 MG/ML) CONTINUOUS INHALATION SOLN
10.0000 mg/h | INHALATION_SOLUTION | Freq: Once | RESPIRATORY_TRACT | Status: AC
  Administered 2014-10-17: 10 mg/h via RESPIRATORY_TRACT
  Filled 2014-10-17: qty 20

## 2014-10-17 MED ORDER — GUAIFENESIN-DM 100-10 MG/5ML PO SYRP
5.0000 mL | ORAL_SOLUTION | ORAL | Status: DC | PRN
  Administered 2014-10-18: 5 mL via ORAL
  Filled 2014-10-17 (×2): qty 5

## 2014-10-17 MED ORDER — FLUTICASONE PROPIONATE 50 MCG/ACT NA SUSP
2.0000 | Freq: Every day | NASAL | Status: DC
  Administered 2014-10-17 – 2014-10-18 (×2): 2 via NASAL
  Filled 2014-10-17: qty 16

## 2014-10-17 MED ORDER — CEFTRIAXONE SODIUM IN DEXTROSE 20 MG/ML IV SOLN: 1.0000 g | INTRAVENOUS | Status: DC

## 2014-10-17 MED ORDER — IPRATROPIUM-ALBUTEROL 0.5-2.5 (3) MG/3ML IN SOLN
3.0000 mL | Freq: Four times a day (QID) | RESPIRATORY_TRACT | Status: DC
  Administered 2014-10-18 (×3): 3 mL via RESPIRATORY_TRACT
  Filled 2014-10-17 (×3): qty 3

## 2014-10-17 MED ORDER — METHYLPREDNISOLONE SODIUM SUCC 125 MG IJ SOLR
125.0000 mg | Freq: Once | INTRAMUSCULAR | Status: AC
  Administered 2014-10-17: 125 mg via INTRAVENOUS
  Filled 2014-10-17: qty 2

## 2014-10-17 MED ORDER — DEXTROSE 5 % IV SOLN
500.0000 mg | Freq: Once | INTRAVENOUS | Status: AC
  Administered 2014-10-17: 500 mg via INTRAVENOUS
  Filled 2014-10-17: qty 500

## 2014-10-17 MED ORDER — SODIUM CHLORIDE 0.9 % IV BOLUS (SEPSIS)
500.0000 mL | Freq: Once | INTRAVENOUS | Status: AC
  Administered 2014-10-17: 500 mL via INTRAVENOUS

## 2014-10-17 MED ORDER — ACETAMINOPHEN 325 MG PO TABS: 650.0000 mg | ORAL_TABLET | Freq: Four times a day (QID) | ORAL | Status: DC | PRN

## 2014-10-17 NOTE — H&P (Signed)
Date: 10/17/2014               Patient Name:  Bryan Barr MRN: [id number]  DOB: [birth year] Age / Sex: 39 y.o., male   PCP: None currently              Medical Service: Internal Medicine Teaching Service              Attending Physician: Dr. Levert Feinstein, MD    First Contact: Dr. Glenard Haring Pager: [phone]  Second Contact: Dr. Delane Ginger Pager: [phone]            After Hours (After 5p/  First Contact Pager: [phone]  weekends / holidays): Second Contact Pager: [phone]             Chief Complaint: Chest tightness, shortness of breath  History of Present Illness:  Mr. Bryan Barr is a 39 yo man with a past medical history significant for bronchitis and seasonal allergies who presents with chest tightness, shortness of breath, and productive cough that acutely worsened last night. Using albuterol helped for ~1.5 hours but then his symptoms returned. Recently, he has been coughing up yellow and brown phlegm and has noticed some occasional bloody phlegm. He uses an albuterol nebulizer at home when he has trouble breathing and has been using it more often, up to 2x a day in the past week. He has had similar episodes of dyspnea and cough this past October, November, December, and January. Per EHR, he was treated in urgent care for pneumonia 03/04/14. In November, he presented to the ED with these symptoms and was treated with continuous albuterol, duoneb, and 5-day prednisone. He denies a history of asthma or lung disease. He denies fevers, diarrhea, constipation, sick contacts, leg swelling, redness in his legs, or long drives. He has not been out of the country in the last year. He was born in [place] but has lived in the [place] for half of his life. He does not have a PCP and has been getting medications from urgent care and the ED.  ED Course: In the ED, he was tachypnic, tachycardic, and afebrile.He received NS 1 L and 0.5 boluses, 2 duoneb treatments, continuous  albuterol nebulizer, and 125 mg of IV solumedrol. At the time of the interview following this intervention, he reported that his chest tightness and shortness of breath had resolved.  Meds: Albuterol nebulizer solution Albuterol 4-6 puffs q6hr Loratidine 10 mg qdaily prn   Allergies: Allergies as of 10/17/2014  . (No Known Allergies)   Past Medical History  Diagnosis Notes  . Bronchitis Childhood  . Seasonal, environmental allergies Pollen, cats   Past Surgical History  Procedure Laterality Date  . Appendectomy  Distant   Family History  Problem Relation Notes  . Diabetes Mother   . Diabetes Father Deceased    Social History: He lives with his wife Wallene Huh and toddler son. He is not a current smoker. Per past notes, he is a former smoker. He drinks some weeks, 2-3 beers at a time. He does not use recreational drugs. He works as a Education administrator. He was born in [place] and has lived in the [place] for half of his life. He does not have a PCP. He has been trying to establish one covered by his current insurance.  Review of Systems: GI: 1 episode of low-volume vomiting and abdominal pain, both of which patient attributes to excessive coughing MSK: Shoulder pain, which patient attributes to coughing   Physical Exam: Blood pressure  132/67, pulse 113, temperature 97.5 F (36.4 C), temperature source Oral, resp. rate 18, SpO2 92 %. General appearance: Receiving breathing treatment for part of interview. Comfortable, no apparent distress, speaking in complete sentences. Lungs: No increased work breathing. Reduced breath sounds in lower lungs. Otherwise clear to auscultation bilaterally. No wheezing. Heart: Tachycardic, regular rhythm.  Extremities: No LE edema. Skin: Diaphoretic. MSK: Tenderness of mid-to-upper back  Lab results: Basic Metabolic Panel:  Recent Labs  81/19/14 0936  NA 139  K 4.1  CL 108  CO2 23  GLUCOSE 125*  BUN 13  CREATININE 0.88  CALCIUM 8.8*    CBC:  Recent Labs  10/17/14 0936  WBC 9.5  NEUTROABS 4.9  HGB 16.3  HCT 46.2  MCV 85.6  PLT 203   Imaging results:  Dg Chest Portable 1 View  10/17/2014   CLINICAL DATA:  39 year old male with shortness of breath and productive cough  EXAM: PORTABLE CHEST - 1 VIEW  COMPARISON:  Prior chest x-ray 03/19/2014  FINDINGS: Linear artifacts projects over the medial aspect of the right apex. This is likely secondary to an external apparatus. Patient is slightly rotated toward the right. Subtle patchy airspace opacity in the left lung base. Otherwise, the lungs are clear. Cardiac and mediastinal contours are within normal limits. No acute osseous abnormality.  IMPRESSION: Subtle patchy airspace opacity in the left lung base may represent early bronchopneumonia in the appropriate clinical setting.   Electronically Signed   By: Malachy Moan M.D.   On: 10/17/2014 08:58    Other results: EKG: Tachycardic, regular rhythm, QTc 466. Right axis deviation vs. left posterior fascicular block, q waves in II, III, aVF unchanged from previous EKG.  Assessment & Plan by Problem: Active Problems:   Community acquired pneumonia  # Community acquired pneumonia Differential includes CAP, bronchitis, asthma, and PE. CAP is the most likely diagnosis given patchy opacity found on CXR, tachycardia, cough, chest tightness, and dyspnea. Viral pneumonia possible given less severe symptoms. Less likely given no fevers, presence of recurrent episodes, and no rales on exam. Bronchitis is consistent with the productive cough and lack of wheezing and crackles on exam. Asthma is possibly contributing given the recurrent episodes of these symptoms and improvement with albuterol and steroids. PE possible given tachycardia but less likely given patient active, no recent surgeries or long car rides, and no signs of DVT noted by patient or on exam. 1.3% change of PE with Wells criteria but can't rule out with PERC given  tachycardia and O2<95%. - Azithromycin IV 500 mg qdaily IV and ceftriaxone IV 1g qdaily - Switch to po agents tomorrow - Breathing treatments: Duonebs q4h scheduled and albuterol 2.5 mg q2hr prn - Treat allergies (see below) - No additional steroids for now given no wheezing. Reassess tomorrow. - Procalcitonin pending to evaluate bacterial infection. If negative consider d/c of antibiotics. - Blood and sputum Gram stain and culture pending - Urine Legionella antigen pending - Strep pneumo and HIV antibodies pending - O2 therapy prn - Recommend outpatient PFTs to assess asthma vs. COPD - D/c with albuterol inhaler if patient needs refill.   #Allergies - Loratidine 10 mg qdaily - Flonase 2 spray/nare qdaily  #Prophylaxis - Lovenox 40 mg for VTE prophylaxis  #FEN/GI - No IVF - No indication to monitor electrolytes - Normal diet  #Dispo planning - Assist finding PCP if desired. Patient has been working on this.  This is a Psychologist, occupational Note.  The care of the patient was  discussed with Dr. Glenard Haring and the assessment and plan was formulated with their assistance.  Please see their note for official documentation of the patient encounter.   Signed: Newton Pigg, Med Student 10/17/2014, 2:53 PM

## 2014-10-17 NOTE — ED Provider Notes (Addendum)
CSN: [id number]     Arrival date & time 10/17/14  6283 History   First MD Initiated Contact with Patient 10/17/14 [phone]     Chief Complaint  Patient presents with  . Shortness of Breath     (Consider location/radiation/quality/duration/timing/severity/associated sxs/prior Treatment) HPI.... Level V caveat for acuity of condition. Patient presents with coughing and wheezing since last night. He is used as home nebulizer treatments with minimal relief. Nonsmoker. History of asthma and bronchitis. Severity is moderate to severe. No other complaints.  Past Medical History  Diagnosis Date  . Bronchitis    Past Surgical History  Procedure Laterality Date  . Appendectomy     Family History  Problem Relation Age of Onset  . Diabetes Mother   . Diabetes Father    History  Substance Use Topics  . Smoking status: Passive Smoke Exposure - Never Smoker    Types: Cigarettes  . Smokeless tobacco: Not on file  . Alcohol Use: Not on file    Review of Systems  Unable to perform ROS: Acuity of condition      Allergies  Review of patient's allergies indicates no known allergies.  Home Medications   Prior to Admission medications   Medication Sig Start Date End Date Taking? Authorizing Provider  albuterol (PROAIR HFA) 108 (90 BASE) MCG/ACT inhaler Inhale 4-6 puffs into the lungs every 6 (six) hours as needed for wheezing or shortness of breath. 03/19/14  Yes Donalee Citrin, MD  albuterol (PROVENTIL) (2.5 MG/3ML) 0.083% nebulizer solution Take 2.5 mg by nebulization every 6 (six) hours as needed for wheezing or shortness of breath.   Yes Historical Provider, MD  ibuprofen (ADVIL,MOTRIN) 200 MG tablet Take 200 mg by mouth every 6 (six) hours as needed for headache or mild pain.    Yes Historical Provider, MD   BP 130/57 mmHg  Pulse 127  Temp(Src) 97.7 F (36.5 C) (Oral)  Resp 29  SpO2 95% Physical Exam  Constitutional: He is oriented to person, place, and time.  Tachypneic, dyspneic,  wheezing  HENT:  Head: Normocephalic and atraumatic.  Eyes: Conjunctivae and EOM are normal. Pupils are equal, round, and reactive to light.  Neck: Normal range of motion. Neck supple.  Cardiovascular: Normal rate and regular rhythm.   Pulmonary/Chest:  Using accessory muscles to respire, wheezing  Abdominal: Soft. Bowel sounds are normal.  Musculoskeletal: Normal range of motion.  Neurological: He is alert and oriented to person, place, and time.  Skin: Skin is warm and dry.  Psychiatric: He has a normal mood and affect. His behavior is normal.  Nursing note and vitals reviewed.   ED Course  Procedures (including critical care time) Labs Review Labs Reviewed  BASIC METABOLIC PANEL - Abnormal; Notable for the following:    Glucose, Bld 125 (*)    Calcium 8.8 (*)    All other components within normal limits  CBC WITH DIFFERENTIAL/PLATELET - Abnormal; Notable for the following:    Eosinophils Relative 21 (*)    Eosinophils Absolute 2.0 (*)    All other components within normal limits  I-STAT CG4 LACTIC ACID, ED  I-STAT CG4 LACTIC ACID, ED    Imaging Review Dg Chest Portable 1 View  10/17/2014   CLINICAL DATA:  39 year old male with shortness of breath and productive cough  EXAM: PORTABLE CHEST - 1 VIEW  COMPARISON:  Prior chest x-ray 03/19/2014  FINDINGS: Linear artifacts projects over the medial aspect of the right apex. This is likely secondary to an external apparatus. Patient  is slightly rotated toward the right. Subtle patchy airspace opacity in the left lung base. Otherwise, the lungs are clear. Cardiac and mediastinal contours are within normal limits. No acute osseous abnormality.  IMPRESSION: Subtle patchy airspace opacity in the left lung base may represent early bronchopneumonia in the appropriate clinical setting.   Electronically Signed   By: Malachy Moan M.D.   On: 10/17/2014 08:58     EKG Interpretation   Date/Time:  Sunday October 17 2014 08:23:20  EDT Ventricular Rate:  107 PR Interval:  143 QRS Duration: 96 QT Interval:  349 QTC Calculation: 466 R Axis:   160 Text Interpretation:  Sinus tachycardia Probable right ventricular  hypertrophy Confirmed by Adriana Simas  MD, Breonia Kirstein ([id number]) on 10/17/2014 10:19:51 AM      MDM   Final diagnoses:  Community acquired pneumonia    Patient required continuous nebulization treatments on his initial presentation. Chest x-ray shows suspected air space disease in the left lung base. Baseline O2 sats are in the low 90s. IV Zithromax, IV Rocephin. Admit to general medicine.    Donnetta Hutching, MD 10/17/14 1158  Donnetta Hutching, MD 10/17/14 [phone]

## 2014-10-17 NOTE — Progress Notes (Addendum)
NURSING PROGRESS NOTE  Bryan Barr [id number] Admission Data: 10/17/2014 2:59 PM Attending Provider: Levert Feinstein, MD YHT:MBPJ Drue Novel, MD Code Status: Full  Bryan Barr is a 39 y.o. male patient admitted from ED:  -No acute distress noted.  -No complaints of shortness of breath.  -No complaints of chest pain.   Blood pressure 132/67, pulse 113, temperature 97.5 F (36.4 C), temperature source Oral, resp. rate 18, SpO2 92 %.   IV Fluids:  IV in place, occlusive dsg intact without redness, IV cath antecubital right, condition patent and no redness none.   Allergies:  Review of patient's allergies indicates no known allergies.  Past Medical History:   has a past medical history of Bronchitis and Seasonal allergies.  Past Surgical History:   has past surgical history that includes Appendectomy.  Social History:   reports that he has been passively smoking Cigarettes.  He does not have any smokeless tobacco history on file.  Skin: Intact  Patient/Family orientated to room. Information packet given to patient/family. Admission inpatient armband information verified with patient/family to include name and date of birth and placed on patient arm. Side rails up x 2, fall assessment and education completed with patient/family. Patient/family able to verbalize understanding of risk associated with falls and verbalized understanding to call for assistance before getting out of bed. Call light within reach. Patient/family able to voice and demonstrate understanding of unit orientation instructions.    Will continue to evaluate and treat per MD orders.

## 2014-10-17 NOTE — ED Notes (Signed)
Pt from home with c/o SOB and wheezing starting last night.  Pt reports using multiple breathing treatments over night with no relief.  Pt in NAD, A&O.

## 2014-10-17 NOTE — ED Notes (Signed)
Attempted report 

## 2014-10-17 NOTE — H&P (Signed)
Date: 10/17/2014               Patient Name:  Bryan Barr MRN: [id number]  DOB: [birth year] Age / Sex: 39 y.o., male   PCP: Wanda Plump, MD              Medical Service: Internal Medicine Teaching Service              Attending Physician: Dr. Levert Feinstein, MD    First Contact: Dr. Glenard Haring Pager: [phone]  Second Contact: Dr. Delane Ginger Pager: [phone]            After Hours (After 5p/  First Contact Pager: [phone]  weekends / holidays): Second Contact Pager: [phone]   Chief Complaint:  Shortness of breath.  History of Present Illness: Bryan Barr is a 39 year old man with history of bronchitis and seasonal allergies presenting with shortness of breath. The patient reports having congestion and a cough productive of yellow sputum that is occasionally brown or tinged with blood.  Last night around 10:30 he developed wheezing and chest tightness.  His cough has been productive of yellow phlegm that is occasionally brown.  He hasn't noticed anything that has helped.  He reports having seasonal allergies that have been acting up a bit the last few days.  He repots having a couple of episodes of vomiting brought on by coughing.  He denies fever, chills, or sick contacts.  He reports pain in his back from coughing. He reports having similar exacerbations 3-4 times last fall, but he has not had any issues recently until last night.  He denies a history of asthma, but he was prescribed an albuterol inhaler and nebulizers by urgent care. He has been using his albuterol inhaler twice a day since last fall, and his albuterol nebulizer provided no relief last night.  He denies any recent surgeries, prolonged immobility, leg swelling, or pain.  In the ER, the patient was tachypneic and tachycardic. The patient was treated with Duonebs and an albuterol continuous nebulizer with improvement improvement of his breathing. Chest x-ray was concerning for early bronchopneumonia, so he was started on IV  ceftriaxone and azithromycin and he was given 1.5 L of normal saline.  Review of Systems: Review of Systems  Constitutional: Positive for malaise/fatigue. Negative for fever and chills.  HENT: Positive for congestion. Negative for sore throat.   Respiratory: Positive for cough, hemoptysis (blood tinged), sputum production, shortness of breath and wheezing.   Cardiovascular: Negative for chest pain, orthopnea and leg swelling.  Gastrointestinal: Positive for vomiting. Negative for abdominal pain, diarrhea and constipation.  Genitourinary: Negative for dysuria and frequency.  Musculoskeletal: Positive for myalgias. Negative for joint pain.  Skin: Negative for rash.  Neurological: Negative for dizziness, sensory change, focal weakness and headaches.    Meds: Medications Prior to Admission  Medication Sig Dispense Refill  . albuterol (PROAIR HFA) 108 (90 BASE) MCG/ACT inhaler Inhale 4-6 puffs into the lungs every 6 (six) hours as needed for wheezing or shortness of breath. 3.7 g 1  . albuterol (PROVENTIL) (2.5 MG/3ML) 0.083% nebulizer solution Take 2.5 mg by nebulization every 6 (six) hours as needed for wheezing or shortness of breath.    Marland Kitchen ibuprofen (ADVIL,MOTRIN) 200 MG tablet Take 200 mg by mouth every 6 (six) hours as needed for headache or mild pain.      No current facility-administered medications for this encounter.    Allergies: Allergies as of 10/17/2014  . (No Known Allergies)   Past  Medical History  Diagnosis Date  . Bronchitis   . Seasonal allergies    Past Surgical History  Procedure Laterality Date  . Appendectomy     Family History  Problem Relation Age of Onset  . Diabetes Mother   . Diabetes Father    History   Social History  . Marital Status: Single    Spouse Name: N/A  . Number of Children: N/A  . Years of Education: N/A   Occupational History  . [employer]    Social History Main Topics  . Smoking status: Passive Smoke Exposure - Never Smoker     Types: Cigarettes  . Smokeless tobacco: Not on file  . Alcohol Use: Not on file  . Drug Use: Not on file  . Sexual Activity: Not on file   Other Topics Concern  . Not on file   Social History Narrative    Physical Exam: Filed Vitals:   10/17/14 1431  BP: 132/67  Pulse: 113  Temp: 97.5 F (36.4 C)  Resp: 18  92% on room air.  Physical Exam  Constitutional: He is oriented to person, place, and time and well-developed, well-nourished, and in no distress. No distress.  HENT:  Head: Normocephalic and atraumatic.  Eyes: Conjunctivae and EOM are normal. Pupils are equal, round, and reactive to light. No scleral icterus.  Neck: Normal range of motion. Neck supple.  Cardiovascular: Regular rhythm and normal heart sounds.   Tachycardic.  Pulmonary/Chest: Effort normal and breath sounds normal. No respiratory distress. He has no wheezes. He has no rales.  Abdominal: Soft. Bowel sounds are normal. He exhibits no distension. There is no tenderness.  Musculoskeletal: Normal range of motion. He exhibits tenderness (palpation of back). He exhibits no edema.  Lymphadenopathy:    He has no cervical adenopathy.  Neurological: He is alert and oriented to person, place, and time. No cranial nerve deficit. He exhibits normal muscle tone.  Skin: Skin is warm. No rash noted. He is diaphoretic. No erythema.    Lab results: Basic Metabolic Panel:  Recent Labs  45/40/98 0936  NA 139  K 4.1  CL 108  CO2 23  GLUCOSE 125*  BUN 13  CREATININE 0.88  CALCIUM 8.8*   CBC:  Recent Labs  10/17/14 0936  WBC 9.5  NEUTROABS 4.9  HGB 16.3  HCT 46.2  MCV 85.6  PLT 203   Imaging results:  Dg Chest Portable 1 View  10/17/2014   CLINICAL DATA:  39 year old male with shortness of breath and productive cough  EXAM: PORTABLE CHEST - 1 VIEW  COMPARISON:  Prior chest x-ray 03/19/2014  FINDINGS: Linear artifacts projects over the medial aspect of the right apex. This is likely secondary to an  external apparatus. Patient is slightly rotated toward the right. Subtle patchy airspace opacity in the left lung base. Otherwise, the lungs are clear. Cardiac and mediastinal contours are within normal limits. No acute osseous abnormality.  IMPRESSION: Subtle patchy airspace opacity in the left lung base may represent early bronchopneumonia in the appropriate clinical setting.   Electronically Signed   By: Malachy Moan M.D.   On: 10/17/2014 08:58    Other results: EKG: Sinus tachycardia, right axis deviation, Q waves in II and III-unchanged.  Assessment & Plan by Problem: Active Problems:   Community acquired pneumonia   #Community acquired pneumonia versus asthma/COPD exacerbation No leukocytosis or fever, but chest x-ray concerning for early pneumonia. He was tachycardic and tachypneic on presentation. He reports smoking tobacco socially in  the past. He has a history of allergies, but no known diagnosis of asthma or COPD. He has not had pulmonary function tests in the past. His chest tightness and wheezing on initial presentation along with responsiveness to breathing treatments suggest that he may have some underlying asthma or COPD.  The patient received ceftriaxone and azithromycin in the ER along with Solu-Medrol 125 mg. -Admit to MedSurg. -Continue ceftriaxone and azithromycin IV for now. -DuoNeb's every 4 hours. -Albuterol nebulizers every 2 hours when necessary. -Hold off on additional steroids for now. -Tylenol 650 mg every 6 hours as needed for pain or fever. -Robitussin-DM every 4 hours when necessary. -Repeat 2 view chest x-ray. -Check procalcitonin. -Blood and sputum cultures. -Check urine Legionella and strep antigen. -Check HIV antibody. -Oxygen therapy as needed to keep saturations greater than 92%. -Regular diet. -Outpatient pulmonary function tests.  #Seasonal allergies The patient reports a history of allergy to pollen along with some congestion recently. He is  not currently on any treatment. -Start loratadine 10 mg daily.  #DVT prophylaxis -Lovenox.  Dispo: Disposition is deferred at this time, awaiting improvement of current medical problems. Anticipated discharge in approximately 1-3 day(s).   The patient does have a current PCP Wanda Plump, MD), therefore will not be require OPC follow-up after discharge.   The patient does not have transportation limitations that hinder transportation to clinic appointments.   Signed:  Luisa Dago, MD, PhD PGY-1 Internal Medicine Teaching Service Pager: [phone] 10/17/2014, 2:49 PM

## 2014-10-17 NOTE — ED Notes (Signed)
Per Dr Adriana Simas, no blood cultures prior to ABX.

## 2014-10-18 ENCOUNTER — Encounter (HOSPITAL_COMMUNITY): Payer: Self-pay | Admitting: Internal Medicine

## 2014-10-18 ENCOUNTER — Observation Stay (HOSPITAL_COMMUNITY): Payer: 59

## 2014-10-18 DIAGNOSIS — J45901 Unspecified asthma with (acute) exacerbation: Secondary | ICD-10-CM | POA: Diagnosis not present

## 2014-10-18 LAB — LEGIONELLA ANTIGEN, URINE

## 2014-10-18 LAB — HIV ANTIBODY (ROUTINE TESTING W REFLEX): HIV SCREEN 4TH GENERATION: NONREACTIVE

## 2014-10-18 MED ORDER — ACETAMINOPHEN 325 MG PO TABS: 650.0000 mg | ORAL_TABLET | Freq: Four times a day (QID) | ORAL | Status: DC | PRN

## 2014-10-18 MED ORDER — LORATADINE 10 MG PO TABS: 10.0000 mg | ORAL_TABLET | Freq: Every day | ORAL | Status: AC

## 2014-10-18 MED ORDER — BUDESONIDE-FORMOTEROL FUMARATE 80-4.5 MCG/ACT IN AERO: 2.0000 | INHALATION_SPRAY | Freq: Two times a day (BID) | RESPIRATORY_TRACT | Status: DC

## 2014-10-18 MED ORDER — FLUTICASONE PROPIONATE 50 MCG/ACT NA SUSP: 2.0000 | Freq: Every day | NASAL | Status: DC

## 2014-10-18 MED ORDER — GUAIFENESIN-DM 100-10 MG/5ML PO SYRP: 5.0000 mL | ORAL_SOLUTION | ORAL | Status: DC | PRN

## 2014-10-18 MED ORDER — PREDNISONE 50 MG PO TABS: 50.0000 mg | ORAL_TABLET | Freq: Every day | ORAL | Status: AC

## 2014-10-18 MED ORDER — ALBUTEROL SULFATE (2.5 MG/3ML) 0.083% IN NEBU: 2.5000 mg | INHALATION_SOLUTION | Freq: Four times a day (QID) | RESPIRATORY_TRACT | Status: DC | PRN

## 2014-10-18 MED ORDER — PREDNISONE 50 MG PO TABS
60.0000 mg | ORAL_TABLET | Freq: Every day | ORAL | Status: DC
  Filled 2014-10-18 (×2): qty 1

## 2014-10-18 MED ORDER — LEVOFLOXACIN 500 MG PO TABS: 500.0000 mg | ORAL_TABLET | Freq: Every day | ORAL | Status: AC

## 2014-10-18 MED ORDER — LEVOFLOXACIN 500 MG PO TABS
500.0000 mg | ORAL_TABLET | Freq: Every day | ORAL | Status: DC
  Filled 2014-10-18: qty 1

## 2014-10-18 MED ORDER — ALBUTEROL SULFATE HFA 108 (90 BASE) MCG/ACT IN AERS: 4.0000 | INHALATION_SPRAY | Freq: Four times a day (QID) | RESPIRATORY_TRACT | Status: DC | PRN

## 2014-10-18 NOTE — Care Management Note (Signed)
Case Management Note  Patient Details  Name: Bryan Barr MRN: [id number] Date of Birth: 04/17/1976  Subjective/Objective:        Patient is for dc today, no needs.           Action/Plan:   Expected Discharge Date:  10/18/14               Expected Discharge Plan:  Home/Self Care  In-House Referral:     Discharge planning Services  CM Consult  Post Acute Care Choice:    Choice offered to:     DME Arranged:    DME Agency:     HH Arranged:    HH Agency:     Status of Service:  Completed, signed off  Medicare Important Message Given:  No Date Medicare IM Given:    Medicare IM give by:    Date Additional Medicare IM Given:    Additional Medicare Important Message give by:     If discussed at Long Length of Stay Meetings, dates discussed:    Additional Comments:  Leone Haven, RN 10/18/2014, 3:11 PM

## 2014-10-18 NOTE — Discharge Summary (Signed)
Name: Bryan Barr MRN: [id number] DOB: 13-Jan-1976 39 y.o. PCP: Wanda Plump, MD  Date of Admission: 10/17/2014  8:19 AM Date of Discharge: 10/18/2014 Attending Physician: Doneen Poisson, MD  Discharge Diagnosis: Principal Problem:   Asthma with acute exacerbation Active Problems:   Allergic rhinitis   Acute bacterial bronchitis  Discharge Medications:   Medication List    STOP taking these medications        ibuprofen 200 MG tablet  Commonly known as:  ADVIL,MOTRIN      TAKE these medications        acetaminophen 325 MG tablet  Commonly known as:  TYLENOL  Take 2 tablets (650 mg total) by mouth every 6 (six) hours as needed for mild pain (or Fever >/= 101).     albuterol 108 (90 BASE) MCG/ACT inhaler  Commonly known as:  PROAIR HFA  Inhale 4-6 puffs into the lungs every 6 (six) hours as needed for wheezing or shortness of breath.     albuterol (2.5 MG/3ML) 0.083% nebulizer solution  Commonly known as:  PROVENTIL  Take 3 mLs (2.5 mg total) by nebulization every 6 (six) hours as needed for wheezing or shortness of breath.     budesonide-formoterol 80-4.5 MCG/ACT inhaler  Commonly known as:  SYMBICORT  Inhale 2 puffs into the lungs 2 (two) times daily.     fluticasone 50 MCG/ACT nasal spray  Commonly known as:  FLONASE  Place 2 sprays into both nostrils daily.     guaiFENesin-dextromethorphan 100-10 MG/5ML syrup  Commonly known as:  ROBITUSSIN DM  Take 5 mLs by mouth every 4 (four) hours as needed for cough.     levofloxacin 500 MG tablet  Commonly known as:  LEVAQUIN  Take 1 tablet (500 mg total) by mouth daily.     loratadine 10 MG tablet  Commonly known as:  CLARITIN  Take 1 tablet (10 mg total) by mouth daily.     predniSONE 50 MG tablet  Commonly known as:  DELTASONE  Take 1 tablet (50 mg total) by mouth daily with breakfast.        Disposition and follow-up:   [name] was discharged from [facility] in Stable  condition.  At the hospital follow up visit please address:  1.  Control of asthma, completion of prednisone taper and antibiotics, arrange pulmonary function tests, consider further work-up for ABPA.  2.  Labs / imaging needed at time of follow-up: None.  3.  Pending labs/ test needing follow-up: Blood culture, NGTD.  Follow-up Appointments: Follow-up Information    Follow up with [facility] On 10/25/2014.   Why:  at 3:15 for follow up after hospital visit   Contact information:   [address] [place] [address] [phone]     Discharge Instructions: Discharge Instructions    Call MD for:  difficulty breathing, headache or visual disturbances    Complete by:  As directed      Call MD for:  extreme fatigue    Complete by:  As directed      Call MD for:  hives    Complete by:  As directed      Call MD for:  persistant dizziness or light-headedness    Complete by:  As directed      Call MD for:  persistant nausea and vomiting    Complete by:  As directed      Call MD for:  severe uncontrolled pain  Complete by:  As directed      Call MD for:  temperature >100.4    Complete by:  As directed      Diet general    Complete by:  As directed      Increase activity slowly    Complete by:  As directed           Thank you for allowing [place] to be involved in your healthcare while you were hospitalized at [facility].   Please note that there have been changes to your home medications.  --> PLEASE LOOK AT YOUR DISCHARGE MEDICATION LIST FOR DETAILS.   Please call [phone] if you have any questions or difficulty getting your medications.  Please return to the ER if you have worsening of your symptoms or new severe symptoms arise.  We think your shortness of breath and wheezing is from an asthma exacerbation. Would like you to continue to take prednisone for 10 days to help reduce the inflammation.  We have started you  on a new inhaler called Symbicort to help prevent these flares in the future.  Only take your albuterol inhaler and nebulizer if you are feeling short of breath.  We also think that you have bronchitis, which is leading to your cough. We would like you to take an antibiotic called Levaquin for 10 days.  Make sure to follow-up in our clinic. We can arrange view to have pulmonary function tests to confirm your diagnosis.  We saw some very small nodules in your lungs on CT.  These are likely benign, and we do not recommend any follow up or treatment.  Consultations: None.  Procedures Performed:  Dg Chest 2 View  10/17/2014   CLINICAL DATA:  Episodic productive cough and shortness of breath over the last 7 months. Episode last eye. Former smoker. Initial encounter.  EXAM: CHEST  2 VIEW  COMPARISON:  Earlier the same date.  03/19/2014.  FINDINGS: The heart size and mediastinal contours are stable. The lungs demonstrate mild central airway thickening, but no focal airspace disease. Specifically, there is no evidence of left basilar infiltrate. There is no pleural effusion or pneumothorax. The bones appear unchanged.  IMPRESSION: Mild central airway thickening.  No evidence of pneumonia.   Electronically Signed   By: Carey Bullocks M.D.   On: 10/17/2014 17:08   Ct Chest High Resolution  10/18/2014   CLINICAL DATA:  39 year old male with shortness of breath and wheezing since yesterday. No response to breathing treatments.  EXAM: CT CHEST WITHOUT CONTRAST  TECHNIQUE: Multidetector CT imaging of the chest was performed following the standard protocol without intravenous contrast. High resolution imaging of the lungs, as well as inspiratory and expiratory imaging, was performed.  COMPARISON:  No priors.  FINDINGS: Mediastinum/Lymph Nodes: Heart size is normal. There is no significant pericardial fluid, thickening or pericardial calcification. No pathologically enlarged mediastinal or hilar lymph nodes.  Please note that accurate exclusion of hilar adenopathy is limited on noncontrast CT scans. Esophagus is unremarkable in appearance. No axillary lymphadenopathy.  Lungs/Pleura: High-resolution images demonstrate diffuse bronchial wall thickening. No significant areas of ground-glass attenuation, subpleural reticulation, parenchymal banding, traction bronchiectasis or frank honeycombing are noted. Inspiratory and expiratory imaging is limited by considerable patient respiratory motion, but is generally unremarkable. Several tiny pulmonary nodules are seen scattered throughout the lungs bilaterally, the largest of which measures only 3 mm in the right lower lobe (image 33 of series 5). No larger more suspicious appearing pulmonary nodules  or masses are noted. No acute consolidative airspace disease. No pleural effusions.  Upper Abdomen: Unremarkable.  Musculoskeletal/Soft Tissues: There are no aggressive appearing lytic or blastic lesions noted in the visualized portions of the skeleton.  IMPRESSION: 1. No findings to suggest interstitial lung disease. 2. Mild diffuse bronchial wall thickening. This could be seen in the setting of reactive airway disease or acute bronchitis. 3. Several tiny pulmonary nodules scattered throughout the lungs bilaterally measuring up to only 3 mm in the right lower lobe (image 33 of series 5). Statistically, these are likely benign in this young patient. If the patient is at high risk for bronchogenic carcinoma, follow-up chest CT at 1 year is recommended. If the patient is at low risk, no follow-up is needed. This recommendation follows the consensus statement: Guidelines for Management of Small Pulmonary Nodules Detected on CT Scans: A Statement from the Fleischner Society as published in Radiology 2005; 237:395-400.   Electronically Signed   By: Trudie Reed M.D.   On: 10/18/2014 12:46   Dg Chest Portable 1 View  10/17/2014   CLINICAL DATA:  39 year old male with shortness of  breath and productive cough  EXAM: PORTABLE CHEST - 1 VIEW  COMPARISON:  Prior chest x-ray 03/19/2014  FINDINGS: Linear artifacts projects over the medial aspect of the right apex. This is likely secondary to an external apparatus. Patient is slightly rotated toward the right. Subtle patchy airspace opacity in the left lung base. Otherwise, the lungs are clear. Cardiac and mediastinal contours are within normal limits. No acute osseous abnormality.  IMPRESSION: Subtle patchy airspace opacity in the left lung base may represent early bronchopneumonia in the appropriate clinical setting.   Electronically Signed   By: Malachy Moan M.D.   On: 10/17/2014 08:58   Admission HPI:  Leonel Wakley is a 39 year old man with history of bronchitis and seasonal allergies presenting with shortness of breath. The patient reports having congestion and a cough productive of yellow sputum that is occasionally brown or tinged with blood. Last night around 10:30 he developed wheezing and chest tightness. His cough has been productive of yellow phlegm that is occasionally brown. He hasn't noticed anything that has helped. He reports having seasonal allergies that have been acting up a bit the last few days. He repots having a couple of episodes of vomiting brought on by coughing. He denies fever, chills, or sick contacts. He reports pain in his back from coughing. He reports having similar exacerbations 3-4 times last fall, but he has not had any issues recently until last night. He denies a history of asthma, but he was prescribed an albuterol inhaler and nebulizers by urgent care. He has been using his albuterol inhaler twice a day since last fall, and his albuterol nebulizer provided no relief last night. He denies any recent surgeries, prolonged immobility, leg swelling, or pain.  In the ER, the patient was tachypneic and tachycardic. The patient was treated with Duonebs and an albuterol continuous nebulizer with  improvement improvement of his breathing. Chest x-ray was concerning for early bronchopneumonia, so he was started on IV ceftriaxone and azithromycin and he was given 1.5 L of normal saline.  Hospital Course by problem list: Principal Problem:   Asthma with acute exacerbation Active Problems:   Allergic rhinitis   Acute bacterial bronchitis   #Asthma exacerbation The patient presented with shortness of breath, wheezing, chest tightness, and a cough productive of yellow sputum. Initial portable chest x-ray was concerning for early pneumonia, but this  was ruled out on repeat 2 view chest x-ray, and his procalcitonin was negative. He reported multiple similar exacerbations over the last year, and had previously been prescribed albuterol, which had been providing him some relief. In addition, he reported a history of seasonal allergies and his serum eosinophils were elevated, making an asthma exacerbation most likely. However, his significant mucus production was atypical for an asthma exacerbation, and prominent distal airways were noted on chest x-ray raising the possibility of bronchiectasis. As result, a high-resolution chest CT was obtained, which showed findings consistent with reactive airway disease.  On review with radiology, there may be some widening of his distal airways and early bronchiectasis.  He was treated with nebulizers and steroids with improvement of his breathing, and he was started on Symbicort daily at discharge along with albuterol as needed. He was given a prednisone burst for 10 days. Follow up was arranged in the [facility] clinic, where he would like to establish with a PCP.  At follow-up, check for resolution of his symptoms and arrange for outpatient pulmonary function tests to confirm his asthma diagnosis. If he continues to have repeated asthma exacerbations in the future, please obtain an IgE level, and if elevated, a serum precipitans for Aspergillus to assess for allergic  bronchopulmonary aspergillosis.  #Acute bacterial bronchitis The patient's significant mucus production was consistent with acute bacterial bronchitis, which may have contributed to his asthma exacerbation. As result, the patient was treated with Levaquin for 10 days at discharge.  #Pulmonary nodules Multiple small <3 mm bilateral pulmonary nodules were noted on high-resolution chest CT. The patient reported previously smoking only 1-3 cigarettes on the weekends for 6 years, so he is at low risk for developing lung cancer.  Per the Fleischner guidelines, these nodules do not require follow-up due to the low probability of malignancy.  Discharge Vitals:   BP 136/87 mmHg  Pulse 74  Temp(Src) 98 F (36.7 C) (Oral)  Resp 16  Ht 5\' 7"  (1.702 m)  SpO2 97%  Discharge Labs:  Basic Metabolic Panel:  Recent Labs  16/10/96 0936  NA 139  K 4.1  CL 108  CO2 23  GLUCOSE 125*  BUN 13  CREATININE 0.88  CALCIUM 8.8*   CBC:  Recent Labs  10/17/14 0936  WBC 9.5  NEUTROABS 4.9  HGB 16.3  HCT 46.2  MCV 85.6  PLT 203   Urine Drug Screen: Drugs of Abuse     Component Value Date/Time   LABOPIA NONE DETECTED 10/17/2014 1623   COCAINSCRNUR NONE DETECTED 10/17/2014 1623   LABBENZ NONE DETECTED 10/17/2014 1623   AMPHETMU NONE DETECTED 10/17/2014 1623   THCU NONE DETECTED 10/17/2014 1623   LABBARB NONE DETECTED 10/17/2014 1623    Signed: Adrian Blackwater Davin Muramoto, MD 10/19/2014, 2:25 PM    Services Ordered on Discharge: None. Equipment Ordered on Discharge: None.

## 2014-10-18 NOTE — Progress Notes (Signed)
Subjective:    Currently, the patient reports continued improvement of his shortness of breath. He continues to have a cough productive of yellow sputum. He reports having an infection when he was approximately 39 years old that his mother told him about. He is not sure if he received antibiotics for the infection.  Interval Events: -Intermittently tachycardic after receiving breathing treatments yesterday.  -High-resolution chest CT with findings consistent with reactive airway disease versus acute bronchitis with no evidence of traction bronchiectasis.    Objective:    Vital Signs:   Temp:  [97.5 F (36.4 C)-98.6 F (37 C)] 98.4 F (36.9 C) (06/13 0513) Pulse Rate:  [85-116] 85 (06/13 0513) Resp:  [16-22] 16 (06/13 0513) BP: (115-132)/(46-67) 115/64 mmHg (06/13 0513) SpO2:  [92 %-96 %] 96 % (06/13 0513) Last BM Date: 10/18/14  24-hour weight change: Weight change:   Intake/Output:  No intake or output data in the 24 hours ending 10/18/14 1338    Physical Exam: General: Well-developed, well-nourished, in no acute distress; alert, appropriate and cooperative throughout examination.  Lungs:  Normal respiratory effort. Inspiratory and expiratory wheezing bilaterally.  Heart: RRR. S1 and S2 normal without gallop, murmur, or rubs.  Abdomen:  BS normoactive. Soft, Nondistended, non-tender.  No masses or organomegaly.  Extremities: No pretibial edema.    Labs:  Basic Metabolic Panel:  Recent Labs Lab 10/17/14 0936  NA 139  K 4.1  CL 108  CO2 23  GLUCOSE 125*  BUN 13  CREATININE 0.88  CALCIUM 8.8*   CBC:  Recent Labs Lab 10/17/14 0936  WBC 9.5  NEUTROABS 4.9  HGB 16.3  HCT 46.2  MCV 85.6  PLT 203   Microbiology: Results for orders placed or performed during the hospital encounter of 10/17/14  Culture, blood (routine x 2) Call MD if unable to obtain prior to antibiotics being given     Status: None (Preliminary result)   Collection Time: 10/17/14  5:15 PM   Result Value Ref Range Status   Specimen Description BLOOD LEFT ANTECUBITAL  Final   Special Requests   Final    BOTTLES DRAWN AEROBIC AND ANAEROBIC 10CC BLUE 7CC RED   Culture   Final           BLOOD CULTURE RECEIVED NO GROWTH TO DATE CULTURE WILL BE HELD FOR 5 DAYS BEFORE ISSUING A FINAL NEGATIVE REPORT Performed at [facility]    Report Status PENDING  Incomplete  Culture, blood (routine x 2) Call MD if unable to obtain prior to antibiotics being given     Status: None (Preliminary result)   Collection Time: 10/17/14  5:22 PM  Result Value Ref Range Status   Specimen Description BLOOD LEFT WRIST  Final   Special Requests BOTTLES DRAWN AEROBIC AND ANAEROBIC 10CC  Final   Culture   Final           BLOOD CULTURE RECEIVED NO GROWTH TO DATE CULTURE WILL BE HELD FOR 5 DAYS BEFORE ISSUING A FINAL NEGATIVE REPORT Performed at [facility]    Report Status PENDING  Incomplete   Imaging: Dg Chest 2 View  10/17/2014   CLINICAL DATA:  Episodic productive cough and shortness of breath over the last 7 months. Episode last eye. Former smoker. Initial encounter.  EXAM: CHEST  2 VIEW  COMPARISON:  Earlier the same date.  03/19/2014.  FINDINGS: The heart size and mediastinal contours are stable. The lungs demonstrate mild central airway thickening, but no focal airspace disease.  Specifically, there is no evidence of left basilar infiltrate. There is no pleural effusion or pneumothorax. The bones appear unchanged.  IMPRESSION: Mild central airway thickening.  No evidence of pneumonia.   Electronically Signed   By: Carey Bullocks M.D.   On: 10/17/2014 17:08   Ct Chest High Resolution  10/18/2014   CLINICAL DATA:  38 year old male with shortness of breath and wheezing since yesterday. No response to breathing treatments.  EXAM: CT CHEST WITHOUT CONTRAST  TECHNIQUE: Multidetector CT imaging of the chest was performed following the standard protocol without intravenous contrast. High  resolution imaging of the lungs, as well as inspiratory and expiratory imaging, was performed.  COMPARISON:  No priors.  FINDINGS: Mediastinum/Lymph Nodes: Heart size is normal. There is no significant pericardial fluid, thickening or pericardial calcification. No pathologically enlarged mediastinal or hilar lymph nodes. Please note that accurate exclusion of hilar adenopathy is limited on noncontrast CT scans. Esophagus is unremarkable in appearance. No axillary lymphadenopathy.  Lungs/Pleura: High-resolution images demonstrate diffuse bronchial wall thickening. No significant areas of ground-glass attenuation, subpleural reticulation, parenchymal banding, traction bronchiectasis or frank honeycombing are noted. Inspiratory and expiratory imaging is limited by considerable patient respiratory motion, but is generally unremarkable. Several tiny pulmonary nodules are seen scattered throughout the lungs bilaterally, the largest of which measures only 3 mm in the right lower lobe (image 33 of series 5). No larger more suspicious appearing pulmonary nodules or masses are noted. No acute consolidative airspace disease. No pleural effusions.  Upper Abdomen: Unremarkable.  Musculoskeletal/Soft Tissues: There are no aggressive appearing lytic or blastic lesions noted in the visualized portions of the skeleton.  IMPRESSION: 1. No findings to suggest interstitial lung disease. 2. Mild diffuse bronchial wall thickening. This could be seen in the setting of reactive airway disease or acute bronchitis. 3. Several tiny pulmonary nodules scattered throughout the lungs bilaterally measuring up to only 3 mm in the right lower lobe (image 33 of series 5). Statistically, these are likely benign in this young patient. If the patient is at high risk for bronchogenic carcinoma, follow-up chest CT at 1 year is recommended. If the patient is at low risk, no follow-up is needed. This recommendation follows the consensus statement:  Guidelines for Management of Small Pulmonary Nodules Detected on CT Scans: A Statement from the Fleischner Society as published in Radiology 2005; 237:395-400.   Electronically Signed   By: Trudie Reed M.D.   On: 10/18/2014 12:46   Dg Chest Portable 1 View  10/17/2014   CLINICAL DATA:  39 year old male with shortness of breath and productive cough  EXAM: PORTABLE CHEST - 1 VIEW  COMPARISON:  Prior chest x-ray 03/19/2014  FINDINGS: Linear artifacts projects over the medial aspect of the right apex. This is likely secondary to an external apparatus. Patient is slightly rotated toward the right. Subtle patchy airspace opacity in the left lung base. Otherwise, the lungs are clear. Cardiac and mediastinal contours are within normal limits. No acute osseous abnormality.  IMPRESSION: Subtle patchy airspace opacity in the left lung base may represent early bronchopneumonia in the appropriate clinical setting.   Electronically Signed   By: Malachy Moan M.D.   On: 10/17/2014 08:58       Medications:    Infusions:    Scheduled Medications: . enoxaparin (LOVENOX) injection  40 mg Subcutaneous Q24H  . fluticasone  2 spray Each Nare Daily  . ipratropium-albuterol  3 mL Nebulization Q6H  . loratadine  10 mg Oral Daily  PRN Medications: acetaminophen **OR** acetaminophen, albuterol, guaiFENesin-dextromethorphan   Assessment/ Plan:    Active Problems:   Allergic rhinitis  #Asthma exacerbation No pneumonia on repeat 2 view chest x-ray and procalcitonin negative. There was some concern for possible bronchiectasis given recurrent episodes of productive cough and history of infection in childhood. However, no evidence of bronchiectasis on CT, and findings are more consistent with reactive airway disease. He likely has poorly treated asthma. He's feeling much better despite his ongoing wheezing on exam. -DuoNeb's every 4 hours. -Albuterol nebulizers every 2 hours when necessary. -Prednisone 60  mg for 10 days. -Tylenol 650 mg every 6 hours as needed for pain or fever. -Start long-acting beta agonist and inhaled steroid at discharge. -Follow-up Legionella urine antigen. -Follow-up HIV antibody. -Oxygen therapy as needed to keep saturations greater than 92%. -Regular diet. -Outpatient pulmonary function tests.  #Severe bacterial bronchitis Asthma exacerbation does not account for ongoing mucus production. Still some concern for early bronchiectasis. -Levaquin 500 mg daily for 10 days.  #Seasonal allergies Post-nasal drip could be contributing to ongoing cough. -Continue loratadine 10 mg daily. -Continue Flonase 2 sprays daily. -Continue Robitussin-DM every 4 hours when necessary.  #Pulmonary nodules Multiple small pulmonary nodules noted on chest CT bilaterally. -Clarify smoking history to determine if patient will need a repeat chest CT in one year.   DVT PPX - low molecular weight heparin  CODE STATUS - Full  CONSULTS PLACED - None  DISPO - Discharge home this afternoon.  The patient does not have a current PCP (Trying to establish with PCP) and does need an [facility] hospital follow-up appointment after discharge.    Is the [facility] hospital follow-up appointment a one-time only appointment? no.  Does the patient have transportation limitations that hinder transportation to clinic appointments? no   SERVICE NEEDED AT DISCHARGE - TO BE DETERMINED DURING HOSPITAL COURSE         Y = Yes, Blank = No PT:   OT:   RN:   Equipment:   Other:      Length of Stay: 1 day(s)   Signed: Donavan Foil, MD  PGY-1, Internal Medicine Resident Pager: [phone] (7AM-5PM) 10/18/2014, 1:38 PM

## 2014-10-18 NOTE — Progress Notes (Signed)
Bryan Barr discharged Home with family per MD order.  Discharge instructions reviewed and discussed with the patient, all questions and concerns answered. Copy of instructions given in English & Spanish, along with care notes given to patient.    Medication List    STOP taking these medications        ibuprofen 200 MG tablet  Commonly known as:  ADVIL,MOTRIN      TAKE these medications        acetaminophen 325 MG tablet  Commonly known as:  TYLENOL  Take 2 tablets (650 mg total) by mouth every 6 (six) hours as needed for mild pain (or Fever >/= 101).     albuterol 108 (90 BASE) MCG/ACT inhaler  Commonly known as:  PROAIR HFA  Inhale 4-6 puffs into the lungs every 6 (six) hours as needed for wheezing or shortness of breath.     albuterol (2.5 MG/3ML) 0.083% nebulizer solution  Commonly known as:  PROVENTIL  Take 3 mLs (2.5 mg total) by nebulization every 6 (six) hours as needed for wheezing or shortness of breath.     budesonide-formoterol 80-4.5 MCG/ACT inhaler  Commonly known as:  SYMBICORT  Inhale 2 puffs into the lungs 2 (two) times daily.     fluticasone 50 MCG/ACT nasal spray  Commonly known as:  FLONASE  Place 2 sprays into both nostrils daily.     guaiFENesin-dextromethorphan 100-10 MG/5ML syrup  Commonly known as:  ROBITUSSIN DM  Take 5 mLs by mouth every 4 (four) hours as needed for cough.     levofloxacin 500 MG tablet  Commonly known as:  LEVAQUIN  Take 1 tablet (500 mg total) by mouth daily.     loratadine 10 MG tablet  Commonly known as:  CLARITIN  Take 1 tablet (10 mg total) by mouth daily.     predniSONE 50 MG tablet  Commonly known as:  DELTASONE  Take 1 tablet (50 mg total) by mouth daily with breakfast.        Patients skin is clean, dry and intact, no evidence of skin break down. IV site discontinued and catheter remains intact. Site without signs and symptoms of complications. Dressing and pressure applied.  Patient escorted to car by NT  in a wheelchair,  no distress noted upon discharge.  Bryan Barr, Bryan Barr 10/18/2014 4:56 PM

## 2014-10-18 NOTE — Discharge Instructions (Addendum)
·   Thank you for allowing [place] to be involved in your healthcare while you were hospitalized at [facility].   Please note that there have been changes to your home medications.  --> PLEASE LOOK AT YOUR DISCHARGE MEDICATION LIST FOR DETAILS.   Please call [phone] if you have any questions or difficulty getting your medications.  Please return to the ER if you have worsening of your symptoms or new severe symptoms arise.  We think your shortness of breath and wheezing is from an asthma exacerbation. Would like you to continue to take prednisone for 10 days to help reduce the inflammation.  We have started you on a new inhaler called Symbicort to help prevent these flares in the future.  Only take your albuterol inhaler and nebulizer if you are feeling short of breath.  We also think that you have bronchitis, which is leading to your cough. We would like you to take an antibiotic called Levaquin for 10 days.  Make sure to follow-up in our clinic. We can arrange view to have pulmonary function tests to confirm your diagnosis.  We saw some very small nodules in your lungs on CT.  These are likely benign, and we do not recommend any follow up or treatment.

## 2014-10-18 NOTE — Progress Notes (Signed)
Subjective: No acute events overnight. Mr. Bryan Barr endorses some shortness of breath and chest tightness but both are significantly improved from presentation. He continues to have a productive cough with yellow sputum. He produced 3-4 tablespoons of mucous over 3-4 hours since waking up. He notes at home this past week, he had darker sputum in the mornings but did not have that today.   When asked about childhood illnesses, he reports having "bronchitis" and going to the doctor frequently when he was 1 1/2 or 2. After that time, he was healthy. He used to smoke 1-3 cigarettes some weekends for 6 years. He no longer smokes.  Interval: Additional 2-way CXR performed last night that showed no evidence of pneumonia. Antibiotics were discontinued.  Objective: Vital signs in last 24 hours: Filed Vitals:   10/17/14 2133 10/17/14 2155 10/18/14 0133 10/18/14 0513  BP:  117/52  115/64  Pulse:  112  85  Temp:  98.6 F (37 C)  98.4 F (36.9 C)  TempSrc:  Oral  Oral  Resp:  21  16  Height:      SpO2: 95% 94% 96% 96%   General appearance: alert, cooperative, comfortable Heart: Regular rate and rhythm. Normal S1S2. No murmurs, rubs, or gallops Lungs: Diffuse inspiratory and expiratory wheezing. No rales. Good air movement. Productive cough. Speaking in complete sentences.  Extremities: No edema bilaterally  Lab Results: Basic Metabolic Panel:  Recent Labs  16/10/96 0936  NA 139  K 4.1  CL 108  CO2 23  GLUCOSE 125*  BUN 13  CREATININE 0.88  CALCIUM 8.8*   CBC:  Recent Labs  10/17/14 0936  WBC 9.5  NEUTROABS 4.9  HGB 16.3  HCT 46.2  MCV 85.6  PLT 203   Procalcitonin <0.1 (10/17/14)  Urine Drug Screen: Drugs of Abuse     Component Value Date/Time   LABOPIA NONE DETECTED 10/17/2014 1623   COCAINSCRNUR NONE DETECTED 10/17/2014 1623   LABBENZ NONE DETECTED 10/17/2014 1623   AMPHETMU NONE DETECTED 10/17/2014 1623   THCU NONE DETECTED 10/17/2014 1623   LABBARB NONE  DETECTED 10/17/2014 1623    Micro Results: Recent Results (from the past 240 hour(s))  Culture, blood (routine x 2) Call MD if unable to obtain prior to antibiotics being given     Status: None (Preliminary result)   Collection Time: 10/17/14  5:15 PM  Result Value Ref Range Status   Specimen Description BLOOD LEFT ANTECUBITAL  Final   Special Requests   Final    BOTTLES DRAWN AEROBIC AND ANAEROBIC 10CC BLUE 7CC RED   Culture   Final           BLOOD CULTURE RECEIVED NO GROWTH TO DATE CULTURE WILL BE HELD FOR 5 DAYS BEFORE ISSUING A FINAL NEGATIVE REPORT Performed at [facility]    Report Status PENDING  Incomplete  Culture, blood (routine x 2) Call MD if unable to obtain prior to antibiotics being given     Status: None (Preliminary result)   Collection Time: 10/17/14  5:22 PM  Result Value Ref Range Status   Specimen Description BLOOD LEFT WRIST  Final   Special Requests BOTTLES DRAWN AEROBIC AND ANAEROBIC 10CC  Final   Culture   Final           BLOOD CULTURE RECEIVED NO GROWTH TO DATE CULTURE WILL BE HELD FOR 5 DAYS BEFORE ISSUING A FINAL NEGATIVE REPORT Performed at [facility]    Report Status PENDING  Incomplete   Studies/Results:  Dg Chest 2 View  10/17/2014   CLINICAL DATA:  Episodic productive cough and shortness of breath over the last 7 months. Episode last eye. Former smoker. Initial encounter.  EXAM: CHEST  2 VIEW  COMPARISON:  Earlier the same date.  03/19/2014.  FINDINGS: The heart size and mediastinal contours are stable. The lungs demonstrate mild central airway thickening, but no focal airspace disease. Specifically, there is no evidence of left basilar infiltrate. There is no pleural effusion or pneumothorax. The bones appear unchanged.  IMPRESSION: Mild central airway thickening.  No evidence of pneumonia.   Electronically Signed   By: Carey Bullocks M.D.   On: 10/17/2014 17:08   Ct Chest High Resolution  10/18/2014   CLINICAL DATA:  38 year old  male with shortness of breath and wheezing since yesterday. No response to breathing treatments.  EXAM: CT CHEST WITHOUT CONTRAST  TECHNIQUE: Multidetector CT imaging of the chest was performed following the standard protocol without intravenous contrast. High resolution imaging of the lungs, as well as inspiratory and expiratory imaging, was performed.  COMPARISON:  No priors.  FINDINGS: Mediastinum/Lymph Nodes: Heart size is normal. There is no significant pericardial fluid, thickening or pericardial calcification. No pathologically enlarged mediastinal or hilar lymph nodes. Please note that accurate exclusion of hilar adenopathy is limited on noncontrast CT scans. Esophagus is unremarkable in appearance. No axillary lymphadenopathy.  Lungs/Pleura: High-resolution images demonstrate diffuse bronchial wall thickening. No significant areas of ground-glass attenuation, subpleural reticulation, parenchymal banding, traction bronchiectasis or frank honeycombing are noted. Inspiratory and expiratory imaging is limited by considerable patient respiratory motion, but is generally unremarkable. Several tiny pulmonary nodules are seen scattered throughout the lungs bilaterally, the largest of which measures only 3 mm in the right lower lobe (image 33 of series 5). No larger more suspicious appearing pulmonary nodules or masses are noted. No acute consolidative airspace disease. No pleural effusions.  Upper Abdomen: Unremarkable.  Musculoskeletal/Soft Tissues: There are no aggressive appearing lytic or blastic lesions noted in the visualized portions of the skeleton.  IMPRESSION: 1. No findings to suggest interstitial lung disease. 2. Mild diffuse bronchial wall thickening. This could be seen in the setting of reactive airway disease or acute bronchitis. 3. Several tiny pulmonary nodules scattered throughout the lungs bilaterally measuring up to only 3 mm in the right lower lobe (image 33 of series 5). Statistically, these  are likely benign in this young patient. If the patient is at high risk for bronchogenic carcinoma, follow-up chest CT at 1 year is recommended. If the patient is at low risk, no follow-up is needed. This recommendation follows the consensus statement: Guidelines for Management of Small Pulmonary Nodules Detected on CT Scans: A Statement from the Fleischner Society as published in Radiology 2005; 237:395-400.   Electronically Signed   By: Trudie Reed M.D.   On: 10/18/2014 12:46    Medications: Scheduled Meds: . enoxaparin (LOVENOX) injection  40 mg Subcutaneous Q24H  . fluticasone  2 spray Each Nare Daily  . ipratropium-albuterol  3 mL Nebulization Q6H  . loratadine  10 mg Oral Daily   Continuous Infusions:  PRN Meds:.acetaminophen **OR** acetaminophen, albuterol, guaiFENesin-dextromethorphan Assessment/Plan: Active Problems:   Allergic rhinitis  # Productive cough, wheezing, dyspnea Unlikely CAP given clear 2-way CXR and negative pro-calcitonin. Concern for bronchiectasis and pulmonary disease (e.g. Pulmonary HTN) given airway wall thickening and vasculature visible to distal lung, RAD and large p wave on EKG, recurrent symptomatic episodes with productive cough, and childhood illness. High resolution CT showed diffuse  bronchial wall thickening. Differential also includes asthma and bronchitis and lower on the differential ABPA. Asthma is likely given diffuse wheezing and improvement with breathing treatment. Bronchitis may be contributing given productive cough. - Stop Azithromycin IV 500 mg qdaily IV and ceftriaxone IV 1g qdaily - Breathing treatments: Duonebs q4h scheduled and albuterol 2.5 mg q2hr prn - Treat allergies (see below) - Strep pneumo antigen, legionella antigen, and HIV antibody all negative - Recommend outpatient PFTs to assess asthma vs. Other lung disease - Continue rescue albuterol inhaler prn  - Start Symbicort 2 puffs qdaily at discharge given concern for asthma -  Start Prednisone burst (60 mg today, 50 mg x9 days) - Start Levaquin 500 mg qdaily for 10 days for bronchitis  #Allergies - Loratidine 10 mg qdaily - Flonase 2 spray/nare qdaily  #Prophylaxis - Lovenox 40 mg for VTE prophylaxis  #FEN/GI - No IVF - No indication to monitor electrolytes - Normal diet  #Dispo planning - Follow-up/establish PCP appointment with [facility] on 6/20 at 3:15  This is a Psychologist, occupational Note.  The care of the patient was discussed with Dr. Glenard Haring and the assessment and plan formulated with their assistance.  Please see their attached note for official documentation of the daily encounter.   LOS: 1 day   Newton Pigg, Med Student 10/18/2014, 1:40 PM

## 2014-10-19 DIAGNOSIS — J208 Acute bronchitis due to other specified organisms: Secondary | ICD-10-CM

## 2014-10-19 DIAGNOSIS — J45909 Unspecified asthma, uncomplicated: Secondary | ICD-10-CM

## 2014-10-19 DIAGNOSIS — B9689 Other specified bacterial agents as the cause of diseases classified elsewhere: Secondary | ICD-10-CM

## 2014-10-19 HISTORY — DX: Unspecified asthma, uncomplicated: J45.909

## 2014-10-23 LAB — CULTURE, BLOOD (ROUTINE X 2)
Culture: NO GROWTH
Culture: NO GROWTH

## 2014-10-25 ENCOUNTER — Encounter: Payer: Self-pay | Admitting: Internal Medicine

## 2014-10-25 ENCOUNTER — Ambulatory Visit (INDEPENDENT_AMBULATORY_CARE_PROVIDER_SITE_OTHER): Payer: 59 | Admitting: Internal Medicine

## 2014-10-25 VITALS — BP 113/69 | HR 84 | Temp 98.3°F | Ht 66.0 in | Wt 193.9 lb

## 2014-10-25 DIAGNOSIS — J4530 Mild persistent asthma, uncomplicated: Secondary | ICD-10-CM

## 2014-10-25 DIAGNOSIS — J45998 Other asthma: Secondary | ICD-10-CM | POA: Diagnosis not present

## 2014-10-25 DIAGNOSIS — Z87891 Personal history of nicotine dependence: Secondary | ICD-10-CM | POA: Diagnosis not present

## 2014-10-25 DIAGNOSIS — Z23 Encounter for immunization: Secondary | ICD-10-CM | POA: Diagnosis not present

## 2014-10-25 DIAGNOSIS — J309 Allergic rhinitis, unspecified: Secondary | ICD-10-CM

## 2014-10-25 DIAGNOSIS — Z0000 Encounter for general adult medical examination without abnormal findings: Secondary | ICD-10-CM

## 2014-10-25 NOTE — Progress Notes (Deleted)
[facility] Subjective:   Patient ID: Bryan Barr male   DOB: Jul 15, 1975 39 y.o.   MRN: [id number]  HPI: BryanBryan Barr is a 39 y.o. male with a PMH detailed below who presents for ***    Past Medical History  Diagnosis Date  . Bronchitis   . Seasonal allergies    Current Outpatient Prescriptions  Medication Sig Dispense Refill  . acetaminophen (TYLENOL) 325 MG tablet Take 2 tablets (650 mg total) by mouth every 6 (six) hours as needed for mild pain (or Fever >/= 101). 30 tablet 2  . albuterol (PROAIR HFA) 108 (90 BASE) MCG/ACT inhaler Inhale 4-6 puffs into the lungs every 6 (six) hours as needed for wheezing or shortness of breath. 3.7 g 2  . albuterol (PROVENTIL) (2.5 MG/3ML) 0.083% nebulizer solution Take 3 mLs (2.5 mg total) by nebulization every 6 (six) hours as needed for wheezing or shortness of breath. 75 mL 12  . budesonide-formoterol (SYMBICORT) 80-4.5 MCG/ACT inhaler Inhale 2 puffs into the lungs 2 (two) times daily. 1 Inhaler 12  . fluticasone (FLONASE) 50 MCG/ACT nasal spray Place 2 sprays into both nostrils daily. 16 g 2  . guaiFENesin-dextromethorphan (ROBITUSSIN DM) 100-10 MG/5ML syrup Take 5 mLs by mouth every 4 (four) hours as needed for cough. 118 mL 0  . levofloxacin (LEVAQUIN) 500 MG tablet Take 1 tablet (500 mg total) by mouth daily. 9 tablet 0  . loratadine (CLARITIN) 10 MG tablet Take 1 tablet (10 mg total) by mouth daily. 30 tablet 2  . predniSONE (DELTASONE) 50 MG tablet Take 1 tablet (50 mg total) by mouth daily with breakfast. 9 tablet 0   No current facility-administered medications for this visit.   Family History  Problem Relation Age of Onset  . Diabetes Mother   . Diabetes Father    History   Social History  . Marital Status: Single    Spouse Name: N/A  . Number of Children: N/A  . Years of Education: N/A   Occupational History  . [employer]    Social History Main Topics  . Smoking status: Former  Smoker    Types: Cigarettes  . Smokeless tobacco: Not on file     Comment: Smoked 1-3 cigarettes on weekends for 6 years.  . Alcohol Use: Not on file  . Drug Use: Not on file  . Sexual Activity: Not on file   Other Topics Concern  . None   Social History Narrative   Review of Systems: *** Objective:  Physical Exam: Filed Vitals:   10/25/14 1534  BP: 113/69  Pulse: 84  Temp: 98.3 F (36.8 C)  TempSrc: Oral  Height: 5\' 6"  (1.676 m)  Weight: 193 lb 14.4 oz (87.952 kg)  SpO2: 99%   *** Assessment & Plan:  Case discussed with Dr. Marland Kitchen  No problem-specific assessment & plan notes found for this encounter.   Medications Ordered No orders of the defined types were placed in this encounter.   Other Orders No orders of the defined types were placed in this encounter.   Follow Up: No Follow-up on file.

## 2014-10-25 NOTE — Progress Notes (Signed)
[facility] Subjective:   Patient ID: Bryan Barr male   DOB: 12/25/1975 39 y.o.   MRN: [id number]  HPI: Bryan Barr is a 39 y.o. spanish speakingmale with a PMH detailed below who presents for hospital follow up after treatment for an asthma exacerbation.  He is spanish speaking and we have an intreperter present.  He reports he is doing very well, he has 2 days of levquin and 3 days of prednisone left.  He denies any wheezing or SOB. He has not had to use his albuterol inhaler since discharge.  His cough has resolved.  For his asthma he reports he had asthma as a child then never had a problem till about November of 2015 when this hospitalization was his 3 exacerbation this year.  He was only using albuterol for asthma.  Past Medical History  Diagnosis Date  . Bronchitis   . Seasonal allergies   . Asthma in adult 10/19/2014   Current Outpatient Prescriptions  Medication Sig Dispense Refill  . acetaminophen (TYLENOL) 325 MG tablet Take 2 tablets (650 mg total) by mouth every 6 (six) hours as needed for mild pain (or Fever >/= 101). 30 tablet 2  . albuterol (PROAIR HFA) 108 (90 BASE) MCG/ACT inhaler Inhale 4-6 puffs into the lungs every 6 (six) hours as needed for wheezing or shortness of breath. 3.7 g 2  . albuterol (PROVENTIL) (2.5 MG/3ML) 0.083% nebulizer solution Take 3 mLs (2.5 mg total) by nebulization every 6 (six) hours as needed for wheezing or shortness of breath. 75 mL 12  . budesonide-formoterol (SYMBICORT) 80-4.5 MCG/ACT inhaler Inhale 2 puffs into the lungs 2 (two) times daily. 1 Inhaler 12  . fluticasone (FLONASE) 50 MCG/ACT nasal spray Place 2 sprays into both nostrils daily. 16 g 2  . guaiFENesin-dextromethorphan (ROBITUSSIN DM) 100-10 MG/5ML syrup Take 5 mLs by mouth every 4 (four) hours as needed for cough. 118 mL 0  . levofloxacin (LEVAQUIN) 500 MG tablet Take 1 tablet (500 mg total) by mouth daily. 9 tablet 0  . loratadine  (CLARITIN) 10 MG tablet Take 1 tablet (10 mg total) by mouth daily. 30 tablet 2  . predniSONE (DELTASONE) 50 MG tablet Take 1 tablet (50 mg total) by mouth daily with breakfast. 9 tablet 0   No current facility-administered medications for this visit.   Family History  Problem Relation Age of Onset  . Diabetes Mother   . Diabetes Father    History   Social History  . Marital Status: Single    Spouse Name: N/A  . Number of Children: N/A  . Years of Education: N/A   Occupational History  . [employer]    Social History Main Topics  . Smoking status: Former Smoker    Types: Cigarettes  . Smokeless tobacco: Not on file     Comment: Smoked 1-3 cigarettes on weekends for 6 years.  . Alcohol Use: Not on file  . Drug Use: Not on file  . Sexual Activity: Not on file   Other Topics Concern  . None   Social History Narrative   Review of Systems: Review of Systems  Constitutional: Negative for fever, chills and malaise/fatigue.  Eyes: Negative for blurred vision.  Respiratory: Negative for cough, sputum production, shortness of breath and wheezing.   Cardiovascular: Negative for chest pain.  Gastrointestinal: Negative for heartburn.  Musculoskeletal: Negative for myalgias.  Neurological: Negative for headaches.     Objective:  Physical Exam: Filed Vitals:   10/25/14 1534  BP: 113/69  Pulse: 84  Temp: 98.3 F (36.8 C)  TempSrc: Oral  Height:  (1.676 m)  Weight: 193 lb 14.4 oz (87.952 kg)  SpO2: 99%  Physical Exam  Constitutional: He is well-developed, well-nourished, and in no distress.  HENT:  Swelling L>R of inferior turbinates  Cardiovascular: Normal rate, regular rhythm and normal heart sounds.   Pulmonary/Chest: Effort normal and breath sounds normal. No respiratory distress. He has no wheezes.  Abdominal: Soft. Bowel sounds are normal.  Musculoskeletal: He exhibits no edema.  Skin: Skin is warm and dry.  Psychiatric: Affect normal.  Nursing note and vitals  reviewed.    Assessment & Plan:  Case discussed with Dr. Heide Spark   Asthma in adult - Hard to fully assess his asthma severeity at this time.  I believe he may be mild persistent but his frequent exacerbations are concerning. -  Patient will soon finish course of prednisone and levquin. - I have stressed compliance with Symbicort BID. - Continue Albuterol PRN and lortadine. - Will get formal PFTs  Allergic rhinitis - Continue lortadine, and flonase, he does have significant swelling of his inferior turbinates.  Health care maintenance Tdap vaccine today - Check Lipid panel per patient request.    Medications Ordered No orders of the defined types were placed in this encounter.   Other Orders Orders Placed This Encounter  Procedures  . Tdap vaccine greater than or equal to 7yo IM  . Lipid Profile    Standing Status: Future     Number of Occurrences:      Standing Expiration Date: 01/04/2015  . Pulmonary function test    Standing Status: Future     Number of Occurrences:      Standing Expiration Date: 10/25/2015    Order Specific Question:  Where should this test be performed?    Answer:  [facility]    Order Specific Question:  Full PFT: includes the following: basic spirometry, spirometry pre & post bronchodilator, diffusion capacity (DLCO), lung volumes    Answer:  Full PFT   Follow Up: Return in about 3 months (around 01/25/2015), or if symptoms worsen or fail to improve.

## 2014-10-25 NOTE — Patient Instructions (Signed)
General Instructions:  Please continue to use symbicort twice a day.  Use albuterol as needed.  Finish the course.  Please bring your medicines with you each time you come to clinic.  Medicines may include prescription medications, over-the-counter medications, herbal remedies, eye drops, vitamins, or other pills.   Progress Toward Treatment Goals:  No flowsheet data found.  Self Care Goals & Plans:  No flowsheet data found.  No flowsheet data found.   Care Management & Community Referrals:  No flowsheet data found.

## 2014-10-27 ENCOUNTER — Encounter: Payer: Self-pay | Admitting: Internal Medicine

## 2014-10-27 NOTE — Assessment & Plan Note (Signed)
-   Continue lortadine, and flonase, he does have significant swelling of his inferior turbinates.

## 2014-10-27 NOTE — Assessment & Plan Note (Signed)
Tdap vaccine today - Check Lipid panel per patient request.

## 2014-10-27 NOTE — Assessment & Plan Note (Signed)
-   Hard to fully assess his asthma severeity at this time.  I believe he may be mild persistent but his frequent exacerbations are concerning. -  Patient will soon finish course of prednisone and levquin. - I have stressed compliance with Symbicort BID. - Continue Albuterol PRN and lortadine. - Will get formal PFTs

## 2014-10-29 NOTE — Progress Notes (Signed)
INTERNAL MEDICINE TEACHING ATTENDING ADDENDUM - Gerold Sar, MD: I reviewed and discussed at the time of visit with the resident Dr. Hoffman, the patient's medical history, physical examination, diagnosis and results of pertinent tests and treatment and I agree with the patient's care as documented.  

## 2014-11-24 ENCOUNTER — Encounter: Payer: Self-pay | Admitting: Internal Medicine

## 2014-11-24 ENCOUNTER — Ambulatory Visit (INDEPENDENT_AMBULATORY_CARE_PROVIDER_SITE_OTHER): Payer: 59 | Admitting: Internal Medicine

## 2014-11-24 ENCOUNTER — Other Ambulatory Visit: Payer: Self-pay | Admitting: *Deleted

## 2014-11-24 VITALS — BP 121/89 | HR 64 | Temp 97.8°F | Ht 66.0 in | Wt 193.3 lb

## 2014-11-24 DIAGNOSIS — Z0000 Encounter for general adult medical examination without abnormal findings: Secondary | ICD-10-CM

## 2014-11-24 DIAGNOSIS — J208 Acute bronchitis due to other specified organisms: Secondary | ICD-10-CM

## 2014-11-24 DIAGNOSIS — B9689 Other specified bacterial agents as the cause of diseases classified elsewhere: Secondary | ICD-10-CM

## 2014-11-24 DIAGNOSIS — Z7951 Long term (current) use of inhaled steroids: Secondary | ICD-10-CM | POA: Diagnosis not present

## 2014-11-24 DIAGNOSIS — J4530 Mild persistent asthma, uncomplicated: Secondary | ICD-10-CM

## 2014-11-24 DIAGNOSIS — J309 Allergic rhinitis, unspecified: Secondary | ICD-10-CM

## 2014-11-24 MED ORDER — MONTELUKAST SODIUM 10 MG PO TABS: 10.0000 mg | ORAL_TABLET | Freq: Every day | ORAL | Status: DC

## 2014-11-24 NOTE — Addendum Note (Signed)
Addended by: Bufford SpikesFULCHER, Lulamae Skorupski N on: 11/24/2014 09:29 AM   Modules accepted: Orders

## 2014-11-24 NOTE — Progress Notes (Signed)
Medicine attending: I personally interviewed and briefly examined this patient and reviewed pertinent laboratory and radiographic data together with resident physician Dr.Vishal Allena KatzPatel and I concur with his evaluation and management plan.

## 2014-11-24 NOTE — Assessment & Plan Note (Signed)
Fasting lipid panel checked today. As per request from previous visit.

## 2014-11-24 NOTE — Assessment & Plan Note (Addendum)
Patient has seasonal allergies treated with Flonase and Claritin. He has asthma as well with symptoms of SOB several times in a day, but not daily. He has cough productive of yellow sputum for the last week that lasts throughout the day and sometimes in the morning. He has some chest tenderness when he coughs. He has tried OTC cough syrup without relief. He denies any recent fever, chills, sweating, sick contacts, or changes in bowel movement. No muscle or joint pain. No rhinorrhea or ear pain/discharge.  Patient's history of asthma, seasonal allergies, and bronchitis suggest this is likely a chronic cough that he will experience from time to time. No evidence of bacterial infection. He is already on Symbicort BID, Albuterol PRN, Flonase, and Claritin. Will try Singulair for symptomatic relief of both asthma and allergic processes.  -Continue Flonase and Claritin. -Start Singulair 10 mg once daily at night.

## 2014-11-24 NOTE — Patient Instructions (Signed)
Thank you for your visit.  I am adding another inhaler which is a longer acting medicine for your shortness of breath. You can use your Albuterol as needed.  We will check your cholesterol today.  Your cough is a chronic condition which may come and go and may take a few weeks to resolve.  Chronic Asthmatic Bronchitis Chronic asthmatic bronchitis is a complication of persistent asthma. After a period of time with asthma, some people develop airflow obstruction that is present all the time, even when not having an asthma attack.There is also persistent inflammation of the airways, and the bronchial tubes produce more mucus. Chronic asthmatic bronchitis usually is a permanent problem with the lungs. CAUSES  Chronic asthmatic bronchitis happens most often in people who have asthma and also smoke cigarettes. Occasionally, it can happen to a person with long-standing or severe asthma even if the person is not a smoker. SIGNS AND SYMPTOMS  Chronic asthmatic bronchitis usually causes symptoms of both asthma and chronic bronchitis, including:   Coughing.  Increased sputum production.  Wheezing and shortness of breath.  Chest discomfort.  Recurring infections. DIAGNOSIS  Your health care provider will take a medical history and perform a physical exam. Chronic asthmatic bronchitis is suspected when a person with asthma has abnormal results on breathing tests (pulmonary function tests) even when breathing symptoms are at their best. Other tests, such as a chest X-ray, may be performed to rule out other conditions.  TREATMENT  Treatment involves controlling symptoms with medicine and lifestyle changes.  Your health care provider may prescribe asthma medicines, including inhaler and nebulizer medicines.  Infection can be treated with medicine to kill germs (antibiotics). Serious infections may require hospitalization. These can include:  Pneumonia.  Sinus infections.  Acute bronchitis.    Preventing infection and hospitalization is very important. Get an influenza vaccination every year as directed by your health care provider. Ask your health care provider whether you need a pneumonia vaccine.  Ask your health care provider whether you would benefit from a pulmonary rehabilitation program. HOME CARE INSTRUCTIONS  Take medicines only as directed by your health care provider.  If you are a cigarette smoker, the most important thing that you can do is quit. Talk to your health care provider for help with quitting smoking.  Avoid pollen, dust, animal dander, molds, smoke, and other things that cause attacks.  Regular exercise is very important to help you feel better. Discuss possible exercise routines with your health care provider.  If animal dander is the cause of asthma, you may not be able to keep pets.  It is important that you:  Become educated about your medical condition.  Participate in maintaining wellness.  Seek medical care as directed. Delay in seeking medical care could cause permanent injury and may be a risk to your life. SEEK MEDICAL CARE IF:  You have wheezing and shortness of breath even if taking medicine to prevent attacks.  You have muscle aches, chest pain, or thickening of sputum.  Your sputum changes from clear or white to yellow, green, gray, or bloody. SEEK IMMEDIATE MEDICAL CARE IF:  Your usual medicines do not stop your wheezing.  You have increased coughing or shortness of breath or both.  You have increased difficulty breathing.  You have any problems from the medicine you are taking, such as a rash, itching, swelling, or trouble breathing. MAKE SURE YOU:   Understand these instructions.  Will watch your condition.  Will get help right away  if you are not doing well or get worse. Document Released: 02/08/2006 Document Revised: 09/07/2013 Document Reviewed: 06/01/2013 [facility] Patient Information 2015 [place], [place]. This  information is not intended to replace advice given to you by your health care provider. Make sure you discuss any questions you have with your health care provider.

## 2014-11-24 NOTE — Progress Notes (Signed)
Patient ID: Bryan Barr, male   DOB: 04/11/1976, 39 y.o.   MRN: [id number]   Subjective:   Patient ID: Bryan Barr male   DOB: 04/24/1976 39 y.o.   MRN: [id number]  HPI: [name] is a 39 y.o. male with PMH of bronchitis, asthma, and seasonal allergies. He is visiting us today with complaints of cough for the last week. He is spanish speaking and communication is done through a Nurse, learning disabilitytranslator.  Please see problem list for details.  Past Medical History  Diagnosis Date  . Bronchitis   . Seasonal allergies   . Asthma in adult 10/19/2014   Current Outpatient Prescriptions  Medication Sig Dispense Refill  . acetaminophen (TYLENOL) 325 MG tablet Take 2 tablets (650 mg total) by mouth every 6 (six) hours as needed for mild pain (or Fever >/= 101). 30 tablet 2  . albuterol (PROAIR HFA) 108 (90 BASE) MCG/ACT inhaler Inhale 4-6 puffs into the lungs every 6 (six) hours as needed for wheezing or shortness of breath. 3.7 g 2  . albuterol (PROVENTIL) (2.5 MG/3ML) 0.083% nebulizer solution Take 3 mLs (2.5 mg total) by nebulization every 6 (six) hours as needed for wheezing or shortness of breath. 75 mL 12  . budesonide-formoterol (SYMBICORT) 80-4.5 MCG/ACT inhaler Inhale 2 puffs into the lungs 2 (two) times daily. 1 Inhaler 12  . fluticasone (FLONASE) 50 MCG/ACT nasal spray Place 2 sprays into both nostrils daily. 16 g 2  . guaiFENesin-dextromethorphan (ROBITUSSIN DM) 100-10 MG/5ML syrup Take 5 mLs by mouth every 4 (four) hours as needed for cough. 118 mL 0  . loratadine (CLARITIN) 10 MG tablet Take 1 tablet (10 mg total) by mouth daily. 30 tablet 2  . montelukast (SINGULAIR) 10 MG tablet Take 1 tablet (10 mg total) by mouth at bedtime. 30 tablet 2   No current facility-administered medications for this visit.   Family History  Problem Relation Age of Onset  . Diabetes Mother   . Diabetes Father    History   Social History  . Marital Status: Single    Spouse Name:  N/A  . Number of Children: N/A  . Years of Education: N/A   Occupational History  . [employer] SkeansPainter    Social History Main Topics  . Smoking status: Former Smoker    Types: Cigarettes  . Smokeless tobacco: Not on file     Comment: Smoked 1-3 cigarettes on weekends for 6 years.  . Alcohol Use: Not on file  . Drug Use: Not on file  . Sexual Activity: Not on file   Other Topics Concern  . None   Social History Narrative   Review of Systems: Review of Systems  Constitutional: Negative for fever, chills, weight loss and diaphoresis.  HENT: Negative for ear discharge, ear pain and sore throat.   Respiratory: Positive for cough, sputum production, shortness of breath and wheezing. Negative for hemoptysis and stridor.   Cardiovascular: Negative for chest pain, palpitations, orthopnea and leg swelling.  Gastrointestinal: Negative for nausea, vomiting, abdominal pain, diarrhea and constipation.  Genitourinary: Negative for dysuria, urgency and frequency.  Musculoskeletal: Negative for myalgias, back pain and joint pain.  Neurological: Negative for dizziness and headaches.    Objective:  Physical Exam: Filed Vitals:   11/24/14 0822  BP: 121/89  Pulse: 64  Temp: 97.8 F (36.6 C)  TempSrc: Oral  Height: 5\' 6"  (1.676 m)  Weight: 193 lb 4.8 oz (87.68 kg)  SpO2: 98%   Physical Exam  Constitutional: He is oriented to person,  place, and time. He appears well-developed and well-nourished.  HENT:  Head: Normocephalic and atraumatic.  Right Ear: Tympanic membrane and external ear normal.  Left Ear: Tympanic membrane and external ear normal.  Nose: Right sinus exhibits no maxillary sinus tenderness and no frontal sinus tenderness. Left sinus exhibits no maxillary sinus tenderness and no frontal sinus tenderness.  Mouth/Throat: Uvula is midline, oropharynx is clear and moist and mucous membranes are normal.  Cardiovascular: Normal rate and regular rhythm.   Pulmonary/Chest: No tachypnea. No  respiratory distress. He has wheezes. He exhibits no tenderness.  Positive expiratory wheezes.  Abdominal: Soft. There is no tenderness.  Musculoskeletal: Normal range of motion. He exhibits no edema.  Neurological: He is alert and oriented to person, place, and time.  Skin: Skin is warm.  Psychiatric: He has a normal mood and affect.     Assessment & Plan:  Please see problem based charting for current assessment and plan.

## 2014-11-24 NOTE — Assessment & Plan Note (Signed)
Patient was seen last month in clinic for follow up after hospital visit for acute asthma exacerbation and acute bacterial bronchitis. He finished a course of prednisone and levaquin. Patient states that his symptoms were improved after last visit, but over the last week has been having shortness of breath on minimal exertion several times in a day, but not daily. He uses an Albuterol inhaler at these times which helps. Patient states that he uses his Symbicort as directed. He uses Flonase and Claritin for seasonal allergies.  Patient likely has asthma and viral bronchitis process along with seasonal allergies. 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 He has PFTs scheduled in August.  -Continue Symbicort BID, Albuterol PRN, Flonase and Claritin. -Start Singulair 10 mg once daily at bedtime

## 2014-11-25 LAB — LIPID PANEL
CHOL/HDL RATIO: 4.8 ratio
Cholesterol: 236 mg/dL — ABNORMAL HIGH (ref 0–200)
HDL: 49 mg/dL (ref >=40)
LDL Cholesterol: 154 mg/dL — ABNORMAL HIGH (ref 0–99)
Triglycerides: 163 mg/dL — ABNORMAL HIGH (ref <150)
VLDL: 33 mg/dL (ref 0–40)

## 2014-12-13 ENCOUNTER — Ambulatory Visit (HOSPITAL_COMMUNITY)
Admission: RE | Admit: 2014-12-13 | Discharge: 2014-12-13 | Disposition: A | Discharge status: Home / Self Care | Payer: 59 | Source: Ambulatory Visit | Attending: Internal Medicine | Admitting: Internal Medicine

## 2014-12-13 DIAGNOSIS — J4530 Mild persistent asthma, uncomplicated: Secondary | ICD-10-CM | POA: Diagnosis not present

## 2014-12-13 LAB — PULMONARY FUNCTION TEST
DL/VA % PRED: 139 %
DL/VA: 6.24 ml/min/mmHg/L
DLCO UNC: 32.01 ml/min/mmHg
DLCO unc % pred: 113 %
FEF 25-75 POST: 0.72 L/s
FEF 25-75 Pre: 0.51 L/sec
FEF2575-%Change-Post: 41 %
FEF2575-%Pred-Post: 19 %
FEF2575-%Pred-Pre: 13 %
FEV1-%CHANGE-POST: 12 %
FEV1-%PRED-POST: 42 %
FEV1-%Pred-Pre: 37 %
FEV1-Post: 1.64 L
FEV1-Pre: 1.45 L
FEV1FVC-%CHANGE-POST: 0 %
FEV1FVC-%PRED-PRE: 51 %
FEV6-%CHANGE-POST: 12 %
FEV6-%PRED-POST: 71 %
FEV6-%Pred-Pre: 64 %
FEV6-POST: 3.39 L
FEV6-PRE: 3.02 L
FEV6FVC-%Change-Post: -1 %
FEV6FVC-%Pred-Post: 86 %
FEV6FVC-%Pred-Pre: 88 %
FVC-%Change-Post: 13 %
FVC-%Pred-Post: 82 %
FVC-%Pred-Pre: 72 %
FVC-POST: 3.98 L
FVC-Pre: 3.49 L
Post FEV1/FVC ratio: 41 %
Post FEV6/FVC ratio: 85 %
Pre FEV1/FVC ratio: 42 %
Pre FEV6/FVC Ratio: 87 %
RV % pred: 291 %
RV: 4.78 L
TLC % pred: 134 %
TLC: 8.48 L

## 2014-12-13 MED ORDER — ALBUTEROL SULFATE (2.5 MG/3ML) 0.083% IN NEBU
2.5000 mg | INHALATION_SOLUTION | Freq: Once | RESPIRATORY_TRACT | Status: AC
  Administered 2014-12-13: 2.5 mg via RESPIRATORY_TRACT

## 2015-01-16 ENCOUNTER — Encounter (HOSPITAL_COMMUNITY): Payer: Self-pay | Admitting: *Deleted

## 2015-01-16 ENCOUNTER — Emergency Department (HOSPITAL_COMMUNITY): Payer: 59

## 2015-01-16 ENCOUNTER — Observation Stay (HOSPITAL_COMMUNITY)
Admission: EM | Admit: 2015-01-16 | Discharge: 2015-01-17 | Disposition: A | Discharge status: Home / Self Care | Payer: 59 | Attending: Internal Medicine | Admitting: Internal Medicine

## 2015-01-16 DIAGNOSIS — Z87891 Personal history of nicotine dependence: Secondary | ICD-10-CM | POA: Diagnosis not present

## 2015-01-16 DIAGNOSIS — E785 Hyperlipidemia, unspecified: Secondary | ICD-10-CM | POA: Diagnosis not present

## 2015-01-16 DIAGNOSIS — J45901 Unspecified asthma with (acute) exacerbation: Principal | ICD-10-CM | POA: Diagnosis present

## 2015-01-16 DIAGNOSIS — F1010 Alcohol abuse, uncomplicated: Secondary | ICD-10-CM

## 2015-01-16 DIAGNOSIS — Z7951 Long term (current) use of inhaled steroids: Secondary | ICD-10-CM | POA: Diagnosis not present

## 2015-01-16 LAB — CBC WITH DIFFERENTIAL/PLATELET
Basophils Absolute: 0.1 10*3/uL (ref 0.0–0.1)
Basophils Relative: 1 % (ref 0–1)
Eosinophils Absolute: 1.5 10*3/uL — ABNORMAL HIGH (ref 0.0–0.7)
Eosinophils Relative: 10 % — ABNORMAL HIGH (ref 0–5)
HEMATOCRIT: 48.8 % (ref 39.0–52.0)
Hemoglobin: 16.7 g/dL (ref 13.0–17.0)
LYMPHS PCT: 32 % (ref 12–46)
Lymphs Abs: 5.2 10*3/uL — ABNORMAL HIGH (ref 0.7–4.0)
MCH: 30.3 pg (ref 26.0–34.0)
MCHC: 34.2 g/dL (ref 30.0–36.0)
MCV: 88.6 fL (ref 78.0–100.0)
MONO ABS: 1 10*3/uL (ref 0.1–1.0)
MONOS PCT: 6 % (ref 3–12)
NEUTROS ABS: 8.2 10*3/uL — AB (ref 1.7–7.7)
Neutrophils Relative %: 51 % (ref 43–77)
Platelets: 257 10*3/uL (ref 150–400)
RBC: 5.51 MIL/uL (ref 4.22–5.81)
RDW: 13.1 % (ref 11.5–15.5)
WBC: 16 10*3/uL — ABNORMAL HIGH (ref 4.0–10.5)

## 2015-01-16 LAB — I-STAT ARTERIAL BLOOD GAS, ED
Acid-base deficit: 5 mmol/L — ABNORMAL HIGH (ref 0.0–2.0)
Bicarbonate: 19.4 mEq/L — ABNORMAL LOW (ref 20.0–24.0)
O2 SAT: 91 %
PCO2 ART: 35 mmHg (ref 35.0–45.0)
PH ART: 7.352 (ref 7.350–7.450)
TCO2: 20 mmol/L (ref 0–100)
pO2, Arterial: 63 mmHg — ABNORMAL LOW (ref 80.0–100.0)

## 2015-01-16 LAB — COMPREHENSIVE METABOLIC PANEL
ALK PHOS: 86 U/L (ref 38–126)
ALT: 25 U/L (ref 17–63)
ANION GAP: 10 (ref 5–15)
AST: 28 U/L (ref 15–41)
Albumin: 4.4 g/dL (ref 3.5–5.0)
BILIRUBIN TOTAL: 0.7 mg/dL (ref 0.3–1.2)
BUN: 12 mg/dL (ref 6–20)
CALCIUM: 9 mg/dL (ref 8.9–10.3)
CO2: 25 mmol/L (ref 22–32)
Chloride: 108 mmol/L (ref 101–111)
Creatinine, Ser: 1.07 mg/dL (ref 0.61–1.24)
GFR calc Af Amer: >60 mL/min (ref >60)
Glucose, Bld: 124 mg/dL — ABNORMAL HIGH (ref 65–99)
POTASSIUM: 3.8 mmol/L (ref 3.5–5.1)
Sodium: 143 mmol/L (ref 135–145)
TOTAL PROTEIN: 7 g/dL (ref 6.5–8.1)

## 2015-01-16 MED ORDER — ALBUTEROL (5 MG/ML) CONTINUOUS INHALATION SOLN: 10.0000 mg/h | INHALATION_SOLUTION | Freq: Once | RESPIRATORY_TRACT | Status: DC

## 2015-01-16 MED ORDER — IPRATROPIUM-ALBUTEROL 0.5-2.5 (3) MG/3ML IN SOLN
RESPIRATORY_TRACT | Status: AC
  Filled 2015-01-16: qty 3

## 2015-01-16 MED ORDER — ALBUTEROL (5 MG/ML) CONTINUOUS INHALATION SOLN
INHALATION_SOLUTION | RESPIRATORY_TRACT | Status: AC
  Filled 2015-01-16: qty 20

## 2015-01-16 MED ORDER — ALBUTEROL SULFATE (2.5 MG/3ML) 0.083% IN NEBU
INHALATION_SOLUTION | RESPIRATORY_TRACT | Status: AC
  Filled 2015-01-16: qty 3

## 2015-01-16 MED ORDER — ALBUTEROL (5 MG/ML) CONTINUOUS INHALATION SOLN
10.0000 mg/h | INHALATION_SOLUTION | RESPIRATORY_TRACT | Status: DC
  Administered 2015-01-16: 10 mg/h via RESPIRATORY_TRACT

## 2015-01-16 MED ORDER — MAGNESIUM SULFATE 2 GM/50ML IV SOLN
2.0000 g | Freq: Once | INTRAVENOUS | Status: AC
  Administered 2015-01-16: 2 g via INTRAVENOUS

## 2015-01-16 MED ORDER — ALBUTEROL (5 MG/ML) CONTINUOUS INHALATION SOLN
10.0000 mg/h | INHALATION_SOLUTION | RESPIRATORY_TRACT | Status: AC
  Administered 2015-01-16: 10 mg/h via RESPIRATORY_TRACT

## 2015-01-16 MED ORDER — METHYLPREDNISOLONE SODIUM SUCC 125 MG IJ SOLR
125.0000 mg | Freq: Once | INTRAMUSCULAR | Status: AC
  Administered 2015-01-16: 125 mg via INTRAVENOUS
  Filled 2015-01-16: qty 2

## 2015-01-16 NOTE — ED Provider Notes (Signed)
CSN: [id number]     Arrival date & time 01/16/15  1941 History   First MD Initiated Contact with Patient 01/16/15 1959     Chief Complaint  Patient presents with  . Shortness of Breath     (Consider location/radiation/quality/duration/timing/severity/associated sxs/prior Treatment) Patient is a 39 y.o. male presenting with shortness of breath.  Shortness of Breath Severity:  Severe Onset quality:  Gradual Duration:  1 day Timing:  Constant Progression:  Worsening Chronicity:  Recurrent Context: weather changes   Relieved by:  Nothing Worsened by:  Nothing tried Ineffective treatments:  Inhaler Associated symptoms: wheezing     Past Medical History  Diagnosis Date  . Bronchitis   . Seasonal allergies   . Asthma in adult 10/19/2014   Past Surgical History  Procedure Laterality Date  . Appendectomy     Family History  Problem Relation Age of Onset  . Diabetes Mother   . Diabetes Father    Social History  Substance Use Topics  . Smoking status: Former Smoker    Types: Cigarettes  . Smokeless tobacco: None     Comment: Smoked 1-3 cigarettes on weekends for 6 years.  . Alcohol Use: 0.0 oz/week    0 Standard drinks or equivalent per week    Review of Systems  Unable to perform ROS: Acuity of condition  Respiratory: Positive for shortness of breath and wheezing.       Allergies  Review of patient's allergies indicates no known allergies.  Home Medications   Prior to Admission medications   Medication Sig Start Date End Date Taking? Authorizing Provider  albuterol (PROAIR HFA) 108 (90 BASE) MCG/ACT inhaler Inhale 4-6 puffs into the lungs every 6 (six) hours as needed for wheezing or shortness of breath. 10/18/14  Yes Adrian Blackwater Moding, MD  albuterol (PROVENTIL) (2.5 MG/3ML) 0.083% nebulizer solution Take 3 mLs (2.5 mg total) by nebulization every 6 (six) hours as needed for wheezing or shortness of breath. 10/18/14  Yes Adrian Blackwater Moding, MD  loratadine (CLARITIN)  10 MG tablet Take 1 tablet (10 mg total) by mouth daily. 10/18/14  Yes Adrian Blackwater Moding, MD  montelukast (SINGULAIR) 10 MG tablet Take 1 tablet (10 mg total) by mouth at bedtime. 11/24/14 11/24/15 Yes Darreld Mclean, MD  acetaminophen (TYLENOL) 325 MG tablet Take 2 tablets (650 mg total) by mouth every 6 (six) hours as needed for mild pain (or Fever >/= 101). 10/18/14   Donavan Foil, MD  budesonide-formoterol (SYMBICORT) 80-4.5 MCG/ACT inhaler Inhale 2 puffs into the lungs 2 (two) times daily. Patient not taking: Reported on 01/16/2015 10/18/14   Adrian Blackwater Moding, MD  fluticasone [facility]) 50 MCG/ACT nasal spray Place 2 sprays into both nostrils daily. Patient not taking: Reported on 01/16/2015 10/18/14   Adrian Blackwater Moding, MD  guaiFENesin-dextromethorphan [facility] DM) 100-10 MG/5ML syrup Take 5 mLs by mouth every 4 (four) hours as needed for cough. Patient not taking: Reported on 01/16/2015 10/18/14   Adrian Blackwater Moding, MD   BP 146/118 mmHg  Pulse 140  Temp(Src) 98.7 F (37.1 C) (Axillary)  Resp 40  SpO2 100% Physical Exam  Constitutional: He appears well-developed and well-nourished. He appears distressed.  HENT:  Head: Normocephalic and atraumatic.  Eyes: EOM are normal.  Neck: Normal range of motion.  Cardiovascular: Tachycardia present.   Pulmonary/Chest: He is in respiratory distress. He has wheezes.  Musculoskeletal: He exhibits no edema.  Neurological: He is alert.  Skin: No rash noted. He is diaphoretic.    ED  Course  Procedures (including critical care time) Labs Review Labs Reviewed  CBC WITH DIFFERENTIAL/PLATELET - Abnormal; Notable for the following:    WBC 16.0 (*)    Neutro Abs 8.2 (*)    Lymphs Abs 5.2 (*)    Eosinophils Relative 10 (*)    Eosinophils Absolute 1.5 (*)    All other components within normal limits  COMPREHENSIVE METABOLIC PANEL - Abnormal; Notable for the following:    Glucose, Bld 124 (*)    All other components within normal limits  I-STAT ARTERIAL  BLOOD GAS, ED - Abnormal; Notable for the following:    pO2, Arterial 63.0 (*)    Bicarbonate 19.4 (*)    Acid-base deficit 5.0 (*)    All other components within normal limits    Imaging Review Dg Chest Portable 1 View  01/16/2015   CLINICAL DATA:  39 year old male with shortness of breath history of bronchitis  EXAM: PORTABLE CHEST - 1 VIEW  COMPARISON:  CT dated 10/18/2014  FINDINGS: The heart size and mediastinal contours are within normal limits. Both lungs are clear. The visualized skeletal structures are unremarkable.  IMPRESSION: No active disease.   Electronically Signed   By: Elgie Collard M.D.   On: 01/16/2015 20:40   I have personally reviewed and evaluated these images and lab results as part of my medical decision-making.   EKG Interpretation None      MDM   Final diagnoses:  Asthma exacerbation     Patient is a 39 year old male with a history of asthma that presents with one-day of shortness of breath. Upon arrival to the ED the patient was in respiratory distress and unable to provide history. Patient was significantly labored with accessory muscle use and had bilateral wheezing and poor air movement. Patient was given DuoNeb, Solu-Medrol, magnesium and started on a continuous albuterol. Patient had some improvement of symptoms however given the severity of his disease process warrant admission for further management of care. Patient will be admitted to stepdown unit.    Beverely Risen, MD 01/16/15 1610  Nelva Nay, MD 01/23/15 [phone]

## 2015-01-16 NOTE — ED Notes (Signed)
The pts  Wife reports that he has been sob all day.  He has a hhn machine at home but it has not heoped.  resp distress  To treatment room.  sweating

## 2015-01-16 NOTE — ED Notes (Signed)
He haS A HEADACHE

## 2015-01-16 NOTE — ED Notes (Signed)
BREATHING IS BETTER

## 2015-01-16 NOTE — ED Notes (Signed)
Pt feels better sats normal  noi nasakl 02 needed

## 2015-01-16 NOTE — ED Notes (Signed)
IM Resident paged to [phone] @ 2247.

## 2015-01-17 LAB — BASIC METABOLIC PANEL
Anion gap: 15 (ref 5–15)
BUN: 12 mg/dL (ref 6–20)
CO2: 15 mmol/L — ABNORMAL LOW (ref 22–32)
CREATININE: 1.09 mg/dL (ref 0.61–1.24)
Calcium: 9 mg/dL (ref 8.9–10.3)
Chloride: 109 mmol/L (ref 101–111)
GFR calc Af Amer: >60 mL/min (ref >60)
GLUCOSE: 233 mg/dL — AB (ref 65–99)
POTASSIUM: 3.6 mmol/L (ref 3.5–5.1)
SODIUM: 139 mmol/L (ref 135–145)

## 2015-01-17 LAB — CBC
HEMATOCRIT: 45.5 % (ref 39.0–52.0)
Hemoglobin: 15.6 g/dL (ref 13.0–17.0)
MCH: 30.4 pg (ref 26.0–34.0)
MCHC: 34.3 g/dL (ref 30.0–36.0)
MCV: 88.5 fL (ref 78.0–100.0)
PLATELETS: 216 10*3/uL (ref 150–400)
RBC: 5.14 MIL/uL (ref 4.22–5.81)
RDW: 13.2 % (ref 11.5–15.5)
WBC: 12.9 10*3/uL — AB (ref 4.0–10.5)

## 2015-01-17 LAB — MRSA PCR SCREENING: MRSA BY PCR: NEGATIVE

## 2015-01-17 MED ORDER — BUDESONIDE-FORMOTEROL FUMARATE 80-4.5 MCG/ACT IN AERO
2.0000 | INHALATION_SPRAY | Freq: Two times a day (BID) | RESPIRATORY_TRACT | Status: DC
  Administered 2015-01-17: 2 via RESPIRATORY_TRACT
  Filled 2015-01-17: qty 6.9

## 2015-01-17 MED ORDER — ENOXAPARIN SODIUM 40 MG/0.4ML ~~LOC~~ SOLN: 40.0000 mg | SUBCUTANEOUS | Status: DC

## 2015-01-17 MED ORDER — ALBUTEROL SULFATE (2.5 MG/3ML) 0.083% IN NEBU: 2.5000 mg | INHALATION_SOLUTION | RESPIRATORY_TRACT | Status: DC | PRN

## 2015-01-17 MED ORDER — ACETAMINOPHEN 325 MG PO TABS: 650.0000 mg | ORAL_TABLET | Freq: Four times a day (QID) | ORAL | Status: DC | PRN

## 2015-01-17 MED ORDER — MONTELUKAST SODIUM 10 MG PO TABS: 10.0000 mg | ORAL_TABLET | Freq: Every day | ORAL | Status: DC

## 2015-01-17 MED ORDER — ADULT MULTIVITAMIN W/MINERALS CH
1.0000 | ORAL_TABLET | Freq: Every day | ORAL | Status: DC
  Administered 2015-01-17: 1 via ORAL
  Filled 2015-01-17: qty 1

## 2015-01-17 MED ORDER — GUAIFENESIN-DM 100-10 MG/5ML PO SYRP: 5.0000 mL | ORAL_SOLUTION | ORAL | Status: DC | PRN

## 2015-01-17 MED ORDER — LORAZEPAM 2 MG/ML IJ SOLN: 1.0000 mg | Freq: Four times a day (QID) | INTRAMUSCULAR | Status: DC | PRN

## 2015-01-17 MED ORDER — LORAZEPAM 1 MG PO TABS: 1.0000 mg | ORAL_TABLET | Freq: Four times a day (QID) | ORAL | Status: DC | PRN

## 2015-01-17 MED ORDER — ALBUTEROL SULFATE (2.5 MG/3ML) 0.083% IN NEBU
2.5000 mg | INHALATION_SOLUTION | RESPIRATORY_TRACT | Status: DC
  Administered 2015-01-17 (×3): 2.5 mg via RESPIRATORY_TRACT
  Filled 2015-01-17 (×3): qty 3

## 2015-01-17 MED ORDER — THIAMINE HCL 100 MG/ML IJ SOLN: 100.0000 mg | Freq: Every day | INTRAMUSCULAR | Status: DC

## 2015-01-17 MED ORDER — ACETAMINOPHEN 650 MG RE SUPP: 650.0000 mg | Freq: Four times a day (QID) | RECTAL | Status: DC | PRN

## 2015-01-17 MED ORDER — BUDESONIDE-FORMOTEROL FUMARATE 80-4.5 MCG/ACT IN AERO: 2.0000 | INHALATION_SPRAY | Freq: Two times a day (BID) | RESPIRATORY_TRACT | Status: DC

## 2015-01-17 MED ORDER — FLUTICASONE PROPIONATE 50 MCG/ACT NA SUSP
2.0000 | Freq: Every day | NASAL | Status: DC
  Administered 2015-01-17: 2 via NASAL
  Filled 2015-01-17: qty 16

## 2015-01-17 MED ORDER — ALBUTEROL SULFATE HFA 108 (90 BASE) MCG/ACT IN AERS: 2.0000 | INHALATION_SPRAY | Freq: Four times a day (QID) | RESPIRATORY_TRACT | Status: DC | PRN

## 2015-01-17 MED ORDER — LORATADINE 10 MG PO TABS
10.0000 mg | ORAL_TABLET | Freq: Every day | ORAL | Status: DC
  Administered 2015-01-17: 10 mg via ORAL
  Filled 2015-01-17: qty 1

## 2015-01-17 MED ORDER — VITAMIN B-1 100 MG PO TABS
100.0000 mg | ORAL_TABLET | Freq: Every day | ORAL | Status: DC
  Administered 2015-01-17: 100 mg via ORAL
  Filled 2015-01-17: qty 1

## 2015-01-17 MED ORDER — FOLIC ACID 1 MG PO TABS
1.0000 mg | ORAL_TABLET | Freq: Every day | ORAL | Status: DC
  Administered 2015-01-17: 1 mg via ORAL
  Filled 2015-01-17: qty 1

## 2015-01-17 MED ORDER — PREDNISONE 50 MG PO TABS
60.0000 mg | ORAL_TABLET | Freq: Every day | ORAL | Status: DC
  Administered 2015-01-17: 60 mg via ORAL
  Filled 2015-01-17 (×2): qty 1

## 2015-01-17 MED ORDER — PREDNISONE 20 MG PO TABS: 40.0000 mg | ORAL_TABLET | Freq: Every day | ORAL | Status: DC

## 2015-01-17 MED ORDER — PREDNISONE 50 MG PO TABS: 60.0000 mg | ORAL_TABLET | Freq: Once | ORAL | Status: DC

## 2015-01-17 NOTE — ED Notes (Signed)
Report  Given to rn on  2c

## 2015-01-17 NOTE — Progress Notes (Signed)
Inpatient Diabetes Program Recommendations  AACE/ADA: New Consensus Statement on Inpatient Glycemic Control (2013)  Target Ranges:  Prepandial:   less than 140 mg/dL      Peak postprandial:   less than 180 mg/dL (1-2 hours)      Critically ill patients:  140 - 180 mg/dL   BMET    Component Value Date/Time   NA 139 01/17/2015 0237   K 3.6 01/17/2015 0237   CL 109 01/17/2015 0237   CO2 15* 01/17/2015 0237   GLUCOSE 233* 01/17/2015 0237   BUN 12 01/17/2015 0237   CREATININE 1.09 01/17/2015 0237   CALCIUM 9.0 01/17/2015 0237   GFRNONAA >60 01/17/2015 0237   GFRAA >60 01/17/2015 0237   Reason for Glycemic Review: Hyperglycemia due to steroidal use  Diabetes history: None  Inpatient Diabetes Program Recommendations Correction (SSI): Patient on steroids. Hyperglycemia in labs at 0237 this am, 233 mg/dl. Please consider starting CBGs and Novolog Sensitive scale ACHS for steroid induced hyperglycemia.  Thanks,  Christena Deem RN, MSN, [facility] Inpatient Diabetes Coordinator Team Pager [phone]

## 2015-01-17 NOTE — Discharge Instructions (Signed)
Tome PREDNISONE  (2 tabletas) cada manana por cinco dias.  Use SYMBICORT dos veces cada dia.

## 2015-01-17 NOTE — Progress Notes (Signed)
Went over all d/c instructions including meds & when to take & when to call MD. Pt aware of asthma triggers Wife at bedside

## 2015-01-17 NOTE — Discharge Summary (Signed)
Name: Bryan Barr MRN: [id number] DOB: [birth year] 39 y.o. PCP: Deneise Lever, MD  Date of Admission: 01/16/2015  7:49 PM Date of Discharge: 01/17/2015 Attending Physician: Earl Lagos, MD  Discharge Diagnosis: Active Problems:   Asthma exacerbation  Discharge Medications:   Medication List    TAKE these medications        acetaminophen 325 MG tablet  Commonly known as:  TYLENOL  Take 2 tablets (650 mg total) by mouth every 6 (six) hours as needed for mild pain (or Fever >/= 101).     albuterol (2.5 MG/3ML) 0.083% nebulizer solution  Commonly known as:  PROVENTIL  Take 3 mLs (2.5 mg total) by nebulization every 6 (six) hours as needed for wheezing or shortness of breath.     albuterol 108 (90 BASE) MCG/ACT inhaler  Commonly known as:  PROAIR HFA  Inhale 2-4 puffs into the lungs every 6 (six) hours as needed for wheezing or shortness of breath.     budesonide-formoterol 80-4.5 MCG/ACT inhaler  Commonly known as:  SYMBICORT  Inhale 2 puffs into the lungs 2 (two) times daily.     fluticasone 50 MCG/ACT nasal spray  Commonly known as:  FLONASE  Place 2 sprays into both nostrils daily.     guaiFENesin-dextromethorphan 100-10 MG/5ML syrup  Commonly known as:  ROBITUSSIN DM  Take 5 mLs by mouth every 4 (four) hours as needed for cough.     loratadine 10 MG tablet  Commonly known as:  CLARITIN  Take 1 tablet (10 mg total) by mouth daily.     montelukast 10 MG tablet  Commonly known as:  SINGULAIR  Take 1 tablet (10 mg total) by mouth at bedtime.     predniSONE 20 MG tablet  Commonly known as:  DELTASONE  Take 2 tablets (40 mg total) by mouth daily with breakfast.        Disposition and follow-up:   Bryan Barr was discharged from [facility] in Good condition.  At the hospital follow up visit please address:  1.  Asthma: Bryan Barr was admitted for exacerbation of his asthma triggered most identifiably from not  taking symbicort for the preceding several weeks due to running out and not refilling. He needs to take this as a scheduled medicine daily to minimize the rate of recurrent problems. Also make sure he knows he can call the clinic or have his pharmacy call for refills. He was discharged with a 5 day course of  prednisone.  2.  Labs / imaging needed at time of follow-up: None  3.  Pending labs/ test needing follow-up: None  Follow-up Appointments:     Follow-up Information    Follow up with Deneise Lever, MD. Go on 01/24/2015.   Specialty:  Internal Medicine   Why:  :45 AM, hospital follow up   Contact information:   [address] [place] [address] [phone]       Discharge Instructions: Discharge Instructions    Diet general    Complete by:  As directed            Consultations:    Procedures Performed:  Dg Chest Portable 1 View  01/16/2015   CLINICAL DATA:  39 year old male with shortness of breath history of bronchitis  EXAM: PORTABLE CHEST - 1 VIEW  COMPARISON:  CT dated 10/18/2014  FINDINGS: The heart size and mediastinal contours are within normal limits. Both lungs are clear. The visualized skeletal structures are unremarkable.  IMPRESSION: No active  disease.   Electronically Signed   By: Elgie Collard M.D.   On: 01/16/2015 20:40    Admission HPI: Bryan Barr is a 39 yo male with asthma and seasonal allergies, presenting with a 1 week history of dyspnea. Patient reports worsening shortness of breath for the last week. Shortness of breath is occuring at rest, unrelieved by Albuterol inhaler that he has been using every hour for the last day. He endorses cough, sometimes productive of yellow sputum. He endorses one episode of blood tinged sputum today. He reports his peak flow this week has 350 (normal for him is ~400). Over the last week, he has been using his albuterol inhaler every 3-4 hours (every hour today) and wakes up twice per night. He  is able to carry out his daily activities, but has to stop every 3-4 hours in order to use his inhaler. Over the month prior to this episode, he reports using his albuterol inhaler 4 times/week without nighttime awakenings. He has been hospitalized twice in the last year for asthma exacerbations. He has never required intubation. PFTs August 2016 showed FEV1/FVC 42% predicted, and FEV1 37% predicted.  He endorses adherence to his medications, except his Symbicort, which he ran out of due to no refills. He does not smoke, but drinks 6 beers a day during the week, and more on weekends.   He denies fever, chills, CP, N/V, C/D, abdominal pain, or dysuria.  In the ED, he was given Solumedrol 125 mg And Albuterol 10 mg neb. His aBG was 7.352/35/63.  Hospital Course by problem list: Asthma exacerbation Admitted from ED on oxygen by nasal cannula after initial response to IV solumedrol and q4hr breathing treatments. Initial ABG showed pO2 of 63. Moderate leukocytosis to 16.0 noted but without other evidence of acute infection, no antibiotics administered. Maintained O2 saturation overnight with maximum 3L/min O2. On repeat physical exam in the morning his wheezing was resolved with did continue mild shortness of breath. This improved by afternoon with continued bronchodilator therapy and he was discharged to home with family in good condition.  Discharge Vitals:   BP 134/60 mmHg  Pulse 109  Temp(Src) 97.8 F (36.6 C) (Oral)  Resp 22  Ht 5\' 7"  (1.702 m)  Wt 88.1 kg (194 lb 3.6 oz)  BMI 30.41 kg/m2  SpO2 98%  Discharge Labs:  Results for orders placed or performed during the hospital encounter of 01/16/15 (from the past 24 hour(s))  CBC with Differential/Platelet     Status: Abnormal   Collection Time: 01/16/15  7:54 PM  Result Value Ref Range   WBC 16.0 (H) 4.0 - 10.5 K/uL   RBC 5.51 4.22 - 5.81 MIL/uL   Hemoglobin 16.7 13.0 - 17.0 g/dL   HCT 69.6 29.5 - 28.4 %   MCV 88.6 78.0 - 100.0 fL    MCH 30.3 26.0 - 34.0 pg   MCHC 34.2 30.0 - 36.0 g/dL   RDW 13.2 44.0 - 10.2 %   Platelets 257 150 - 400 K/uL   Neutrophils Relative % 51 43 - 77 %   Neutro Abs 8.2 (H) 1.7 - 7.7 K/uL   Lymphocytes Relative 32 12 - 46 %   Lymphs Abs 5.2 (H) 0.7 - 4.0 K/uL   Monocytes Relative 6 3 - 12 %   Monocytes Absolute 1.0 0.1 - 1.0 K/uL   Eosinophils Relative 10 (H) 0 - 5 %   Eosinophils Absolute 1.5 (H) 0.0 - 0.7 K/uL   Basophils Relative 1 0 -  1 %   Basophils Absolute 0.1 0.0 - 0.1 K/uL  Comprehensive metabolic panel     Status: Abnormal   Collection Time: 01/16/15  7:54 PM  Result Value Ref Range   Sodium 143 135 - 145 mmol/L   Potassium 3.8 3.5 - 5.1 mmol/L   Chloride 108 101 - 111 mmol/L   CO2 25 22 - 32 mmol/L   Glucose, Bld 124 (H) 65 - 99 mg/dL   BUN 12 6 - 20 mg/dL   Creatinine, Ser 1.61 0.61 - 1.24 mg/dL   Calcium 9.0 8.9 - 09.6 mg/dL   Total Protein 7.0 6.5 - 8.1 g/dL   Albumin 4.4 3.5 - 5.0 g/dL   AST 28 15 - 41 U/L   ALT 25 17 - 63 U/L   Alkaline Phosphatase 86 38 - 126 U/L   Total Bilirubin 0.7 0.3 - 1.2 mg/dL   GFR calc non Af Amer >60 >60 mL/min   GFR calc Af Amer >60 >60 mL/min   Anion gap 10 5 - 15  I-Stat Arterial Blood Gas, ED - (order at [facility] and [facility] only)     Status: Abnormal   Collection Time: 01/16/15 10:15 PM  Result Value Ref Range   pH, Arterial 7.352 7.350 - 7.450   pCO2 arterial 35.0 35.0 - 45.0 mmHg   pO2, Arterial 63.0 (L) 80.0 - 100.0 mmHg   Bicarbonate 19.4 (L) 20.0 - 24.0 mEq/L   TCO2 20 0 - 100 mmol/L   O2 Saturation 91.0 %   Acid-base deficit 5.0 (H) 0.0 - 2.0 mmol/L   Patient temperature 98.6 F    Collection site RADIAL, ALLEN'S TEST ACCEPTABLE    Drawn by RT    Sample type ARTERIAL   MRSA PCR Screening     Status: None   Collection Time: 01/17/15 12:57 AM  Result Value Ref Range   MRSA by PCR NEGATIVE NEGATIVE  Basic metabolic panel     Status: Abnormal   Collection Time: 01/17/15  2:37 AM  Result Value Ref Range   Sodium 139 135 - 145  mmol/L   Potassium 3.6 3.5 - 5.1 mmol/L   Chloride 109 101 - 111 mmol/L   CO2 15 (L) 22 - 32 mmol/L   Glucose, Bld 233 (H) 65 - 99 mg/dL   BUN 12 6 - 20 mg/dL   Creatinine, Ser 0.45 0.61 - 1.24 mg/dL   Calcium 9.0 8.9 - 40.9 mg/dL   GFR calc non Af Amer >60 >60 mL/min   GFR calc Af Amer >60 >60 mL/min   Anion gap 15 5 - 15  CBC     Status: Abnormal   Collection Time: 01/17/15  2:37 AM  Result Value Ref Range   WBC 12.9 (H) 4.0 - 10.5 K/uL   RBC 5.14 4.22 - 5.81 MIL/uL   Hemoglobin 15.6 13.0 - 17.0 g/dL   HCT 81.1 91.4 - 78.2 %   MCV 88.5 78.0 - 100.0 fL   MCH 30.4 26.0 - 34.0 pg   MCHC 34.3 30.0 - 36.0 g/dL   RDW 95.6 21.3 - 08.6 %   Platelets 216 150 - 400 K/uL    Signed: Fuller Plan, MD 01/17/2015, 4:21 PM

## 2015-01-17 NOTE — Progress Notes (Signed)
Pt discharged with all d/c info & personal belongings. Pt has his cell phone Accompanied by wife Refused wheelchair

## 2015-01-17 NOTE — H&P (Signed)
Date: 01/17/2015               Patient Name:  Bryan Barr MRN: [id number]  DOB: [birth year] Age / Sex: 39 y.o., male   PCP: Deneise Lever, MD         Medical Service: Internal Medicine Teaching Service         Attending Physician: Dr. Earl Lagos, MD    First Contact: Dr. Dimple Casey Pager: [phone]  Second Contact: Dr. Tasia Catchings Pager: [phone]       After Hours (After 5p/  First Contact Pager: [phone]  weekends / holidays): Second Contact Pager: [phone]   Chief Complaint: Dyspnea  History of Present Illness: Bryan Barr is a 40 yo male with asthma and seasonal allergies, presenting with a 1 week history of dyspnea.  Patient reports worsening shortness of breath for the last week.  Shortness of breath is occuring at rest, unrelieved by Albuterol inhaler that he has been using every hour for the last day.  He endorses cough, sometimes productive of yellow sputum.  He endorses one episode of blood tinged sputum today.  He reports his peak flow this week has 350 (normal for him is ~400).  Over the last week, he has been using his albuterol inhaler every 3-4 hours (every hour today) and wakes up twice per night.  He is able to carry out his daily activities, but has to stop every 3-4 hours in order to use his inhaler.  Over the month prior to this episode, he reports using his albuterol inhaler 4 times/week without nighttime awakenings.  He has been hospitalized twice in the last year for asthma exacerbations.  He has never required intubation.  PFTs August 2016 showed FEV1/FVC 42% predicted, and FEV1 37% predicted.  He endorses adherence to his medications, except his Symbicort, which he ran out of due to no refills.  He does not smoke, but drinks 6 beers a day during the week, and more on weekends.   He denies fever, chills, CP, N/V, C/D, abdominal pain, or dysuria.  In the ED, he was given Solumedrol 125 mg And Albuterol 10 mg neb.  His aBG was 7.352/35/63.  Meds: Current  Facility-Administered Medications  Medication Dose Route Frequency Provider Last Rate Last Dose  . albuterol (PROVENTIL) (2.5 MG/3ML) 0.083% nebulizer solution           . albuterol (PROVENTIL, VENTOLIN) (5 MG/ML) 0.5% continuous inhalation solution           . albuterol (PROVENTIL,VENTOLIN) solution continuous neb  10 mg/hr Nebulization Continuous Nelva Nay, MD 2 mL/hr at 01/16/15 2005 10 mg/hr at 01/16/15 2005  . ipratropium-albuterol (DUONEB) 0.5-2.5 (3) MG/3ML nebulizer solution             Allergies: Allergies as of 01/16/2015  . (No Known Allergies)   Past Medical History  Diagnosis Date  . Bronchitis   . Seasonal allergies   . Asthma in adult 10/19/2014   Past Surgical History  Procedure Laterality Date  . Appendectomy     Family History  Problem Relation Age of Onset  . Diabetes Mother   . Diabetes Father    Social History   Social History  . Marital Status: Single    Spouse Name: N/A  . Number of Children: N/A  . Years of Education: N/A   Occupational History  . [employer] Barr    Social History Main Topics  . Smoking status: Former Smoker    Types: Cigarettes  . Smokeless tobacco: Not on  file     Comment: Smoked 1-3 cigarettes on weekends for 6 years.  . Alcohol Use: 0.0 oz/week    0 Standard drinks or equivalent per week  . Drug Use: Not on file  . Sexual Activity: Not on file   Other Topics Concern  . Not on file   Social History Narrative    Review of Systems: Pertinent items are noted in HPI.  Physical Exam: Blood pressure 131/72, pulse 123, temperature 97.1 F (36.2 C), temperature source Oral, resp. rate 32, height 5\' 7"  (1.702 m), SpO2 95 %. Physical Exam  Constitutional: He is oriented to person, place, and time and well-developed, well-nourished, and in no distress. No distress.  HENT:  Head: Normocephalic and atraumatic.  Eyes: EOM are normal. No scleral icterus.  Neck: No tracheal deviation present.  Cardiovascular: Regular rhythm,  normal heart sounds and intact distal pulses.   Tachycardic.  Pulmonary/Chest: Effort normal. No respiratory distress. He has no wheezes.  Abdominal: Soft. He exhibits no distension. There is no tenderness. There is no rebound and no guarding.  Musculoskeletal: Normal range of motion. He exhibits no edema.  Neurological: He is alert and oriented to person, place, and time.  Skin: Skin is warm and dry. He is not diaphoretic.     Lab results: Basic Metabolic Panel:  Recent Labs  16/10/96 1954  NA 143  K 3.8  CL 108  CO2 25  GLUCOSE 124*  BUN 12  CREATININE 1.07  CALCIUM 9.0   Liver Function Tests:  Recent Labs  01/16/15 1954  AST 28  ALT 25  ALKPHOS 86  BILITOT 0.7  PROT 7.0  ALBUMIN 4.4   No results for input(s): LIPASE, AMYLASE in the last 72 hours. No results for input(s): AMMONIA in the last 72 hours. CBC:  Recent Labs  01/16/15 1954  WBC 16.0*  NEUTROABS 8.2*  HGB 16.7  HCT 48.8  MCV 88.6  PLT 257   Cardiac Enzymes: No results for input(s): CKTOTAL, CKMB, CKMBINDEX, TROPONINI in the last 72 hours. BNP: No results for input(s): PROBNP in the last 72 hours. D-Dimer: No results for input(s): DDIMER in the last 72 hours. CBG: No results for input(s): GLUCAP in the last 72 hours. Hemoglobin A1C: No results for input(s): HGBA1C in the last 72 hours. Fasting Lipid Panel: No results for input(s): CHOL, HDL, LDLCALC, TRIG, CHOLHDL, LDLDIRECT in the last 72 hours. Thyroid Function Tests: No results for input(s): TSH, T4TOTAL, FREET4, T3FREE, THYROIDAB in the last 72 hours. Anemia Panel: No results for input(s): VITAMINB12, FOLATE, FERRITIN, TIBC, IRON, RETICCTPCT in the last 72 hours. Coagulation: No results for input(s): LABPROT, INR in the last 72 hours. Urine Drug Screen: Drugs of Abuse     Component Value Date/Time   LABOPIA NONE DETECTED 10/17/2014 1623   COCAINSCRNUR NONE DETECTED 10/17/2014 1623   LABBENZ NONE DETECTED 10/17/2014 1623    AMPHETMU NONE DETECTED 10/17/2014 1623   THCU NONE DETECTED 10/17/2014 1623   LABBARB NONE DETECTED 10/17/2014 1623    Alcohol Level: No results for input(s): ETH in the last 72 hours. Urinalysis: No results for input(s): COLORURINE, LABSPEC, PHURINE, GLUCOSEU, HGBUR, BILIRUBINUR, KETONESUR, PROTEINUR, UROBILINOGEN, NITRITE, LEUKOCYTESUR in the last 72 hours.  Invalid input(s): APPERANCEUR Misc. Labs:   Imaging results:  Dg Chest Portable 1 View  01/16/2015   CLINICAL DATA:  39 year old male with shortness of breath history of bronchitis  EXAM: PORTABLE CHEST - 1 VIEW  COMPARISON:  CT dated 10/18/2014  FINDINGS: The heart  size and mediastinal contours are within normal limits. Both lungs are clear. The visualized skeletal structures are unremarkable.  IMPRESSION: No active disease.   Electronically Signed   By: Elgie Collard M.D.   On: 01/16/2015 20:40    Other results: EKG: sinus tachycardia.  Assessment & Plan by Problem: Active Problems:   Asthma exacerbation  Mr. Baggerly is a 39 yo male with asthma and seasonal allergies, presenting with a 1 week history of dyspnea.  Acute Asthma Exacerbation: Patient with presenting with 1 week of asthma exacerbation.  Symptoms likely 2/2 medication nonadherence after running out of Symbicort.  Patient has leukocytosis on CBC, though lab drawn at same time patient given steroids and neb, as well as the stress surrounding the exacerbation.  No fevers.  Will defer antibiotics at this time.  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  Expected peak flow is 575-600.   - Albuterol neb q2hours PRN with pre/post peak flow - Prednisone 60 mg PO daily - Robitussin PRN - Symbicort BID - Singulair 10 - Flonase - Claritin - Sputum culture  Alcohol Use D/o: Patient with 6+ drinks per day.  LFTs normal.   - Folate, Thiamine  HLD: Lipid panel from July 2016 with total cholesterol 236 and LDL 154.  Consider statin  initiation.  FEN/GI - NPO  DVT Ppx: Lovenox  Dispo: Disposition is deferred at this time, awaiting improvement of current medical problems.   The patient does have a current PCP Deneise Lever, MD) and does need an [facility] hospital follow-up appointment after discharge.  The patient does not have transportation limitations that hinder transportation to clinic appointments.  Signed: Jana Half, MD, PhD 01/17/2015, 12:26 AM

## 2015-01-17 NOTE — Progress Notes (Signed)
Subjective: Improved greatly since last night with steroids and breathing treatments. He is resting comfortably on room air this morning. On discussion he states he was unsure about taking symbicort inhaled daily versus as needed, with his history of asthma. Agrees with goal of going home later today if he continues feeling improved. No ongoing coughing. Does not feel fever or chills.  Objective: Vital signs in last 24 hours: Filed Vitals:   01/17/15 0756 01/17/15 0833 01/17/15 1219 01/17/15 1535  BP: 139/74  134/60   Pulse: 105  109   Temp: 98.3 F (36.8 C)  97.8 F (36.6 C)   TempSrc: Oral  Oral   Resp: 24  22   Height:      Weight:      SpO2: 95% 97% 95% 98%   Weight change:   Intake/Output Summary (Last 24 hours) at 01/17/15 1555 Last data filed at 01/17/15 1400  Gross per 24 hour  Intake    750 ml  Output      0 ml  Net    750 ml    GENERAL- alert, co-operative, NAD HEENT- Oral mucosa appears moist, good and intact dentition, no cervical LN enlargement. CARDIAC- tachycardia, regular rhythm, no murmurs, rubs or gallops. RESP- Mild diffuse expiratory wheezes are present, no inspiratory crackles, good air movement ABDOMEN- Soft, nontender, no guarding or rebound, normoactive bowel sounds present EXTREMITIES-symmetric, no pedal edema. SKIN- Warm, dry, No rash or lesion. PSYCH- Normal mood and affect, appropriate thought content and speech.  Lab Results: Basic Metabolic Panel:  Recent Labs Lab 01/16/15 1954 01/17/15 0237  NA 143 139  K 3.8 3.6  CL 108 109  CO2 25 15*  GLUCOSE 124* 233*  BUN 12 12  CREATININE 1.07 1.09  CALCIUM 9.0 9.0   Liver Function Tests:  Recent Labs Lab 01/16/15 1954  AST 28  ALT 25  ALKPHOS 86  BILITOT 0.7  PROT 7.0  ALBUMIN 4.4   No results for input(s): LIPASE, AMYLASE in the last 168 hours. No results for input(s): AMMONIA in the last 168 hours. CBC:  Recent Labs Lab 01/16/15 1954 01/17/15 0237  WBC 16.0* 12.9*    NEUTROABS 8.2*  --   HGB 16.7 15.6  HCT 48.8 45.5  MCV 88.6 88.5  PLT 257 216   Cardiac Enzymes: No results for input(s): CKTOTAL, CKMB, CKMBINDEX, TROPONINI in the last 168 hours. BNP: No results for input(s): PROBNP in the last 168 hours. D-Dimer: No results for input(s): DDIMER in the last 168 hours. CBG: No results for input(s): GLUCAP in the last 168 hours. Hemoglobin A1C: No results for input(s): HGBA1C in the last 168 hours. Fasting Lipid Panel: No results for input(s): CHOL, HDL, LDLCALC, TRIG, CHOLHDL, LDLDIRECT in the last 168 hours. Thyroid Function Tests: No results for input(s): TSH, T4TOTAL, FREET4, T3FREE, THYROIDAB in the last 168 hours. Coagulation: No results for input(s): LABPROT, INR in the last 168 hours. Anemia Panel: No results for input(s): VITAMINB12, FOLATE, FERRITIN, TIBC, IRON, RETICCTPCT in the last 168 hours. Urine Drug Screen: Drugs of Abuse     Component Value Date/Time   LABOPIA NONE DETECTED 10/17/2014 1623   COCAINSCRNUR NONE DETECTED 10/17/2014 1623   LABBENZ NONE DETECTED 10/17/2014 1623   AMPHETMU NONE DETECTED 10/17/2014 1623   THCU NONE DETECTED 10/17/2014 1623   LABBARB NONE DETECTED 10/17/2014 1623    Alcohol Level: No results for input(s): ETH in the last 168 hours. Urinalysis: No results for input(s): COLORURINE, LABSPEC, PHURINE, GLUCOSEU, HGBUR,  BILIRUBINUR, KETONESUR, PROTEINUR, UROBILINOGEN, NITRITE, LEUKOCYTESUR in the last 168 hours.  Invalid input(s): APPERANCEUR  Micro Results: Recent Results (from the past 240 hour(s))  MRSA PCR Screening     Status: None   Collection Time: 01/17/15 12:57 AM  Result Value Ref Range Status   MRSA by PCR NEGATIVE NEGATIVE Final    Comment:        The GeneXpert MRSA Assay (FDA approved for NASAL specimens only), is one component of a comprehensive MRSA colonization surveillance program. It is not intended to diagnose MRSA infection nor to guide or monitor treatment for MRSA  infections.    Studies/Results: Dg Chest Portable 1 View  01/16/2015   CLINICAL DATA:  39 year old male with shortness of breath history of bronchitis  EXAM: PORTABLE CHEST - 1 VIEW  COMPARISON:  CT dated 10/18/2014  FINDINGS: The heart size and mediastinal contours are within normal limits. Both lungs are clear. The visualized skeletal structures are unremarkable.  IMPRESSION: No active disease.   Electronically Signed   By: Elgie Collard M.D.   On: 01/16/2015 20:40   Medications: I have reviewed the patient's current medications. Scheduled Meds: . albuterol  2.5 mg Nebulization Q4H  . budesonide-formoterol  2 puff Inhalation BID  . enoxaparin (LOVENOX) injection  40 mg Subcutaneous Q24H  . fluticasone  2 spray Each Nare Daily  . folic acid  1 mg Oral Daily  . loratadine  10 mg Oral Daily  . montelukast  10 mg Oral QHS  . multivitamin with minerals  1 tablet Oral Daily  . predniSONE  60 mg Oral Q breakfast  . thiamine  100 mg Oral Daily   Or  . thiamine  100 mg Intravenous Daily   Continuous Infusions:  PRN Meds:.acetaminophen **OR** acetaminophen, guaiFENesin-dextromethorphan, LORazepam **OR** LORazepam Assessment/Plan: Acute Asthma Exacerbation: Patient improving greatly with systemic steroids and inhaled beta agonist therapy since last night. Leukocytosis improving despite steroids, probably reduction in stress. No fever, no productive cough, no acute indications for antibiotics. Patient should go home with short course systemic steroids and refill of outpatient inhaled therapy. - Albuterol neb q2hours PRN with pre/post peak flow - Prednisone 40 mg PO daily - Robitussin PRN - Symbicort BID - Singulair 10mg  PO - Flonase 2 sprays qdaily - Claritin 10mg  PO - Sputum culture  Alcohol Use Disorder Patient with 6+ drinks per day. LFTs normal.Patient hospital course likely too short for detox concern.  HLD: Lipid panel from July 2016 with total cholesterol 236 and LDL 154. We  would recommend, but will defer statin therapy initiation to his follow up appointment.  DVT Ppx: [place] Lovenox Diet: Regular FULL CODE  Dispo: Disposition is deferred at this time, awaiting improvement of current medical problems.   The patient does have a current PCP Deneise Lever, MD) and does need an [facility] hospital follow-up appointment after discharge.  The patient does not have transportation limitations that hinder transportation to clinic appointments.   LOS: 1 day   Fuller Plan, MD 01/17/2015, 3:55 PM

## 2015-01-18 ENCOUNTER — Telehealth: Payer: Self-pay | Admitting: *Deleted

## 2015-01-18 NOTE — Telephone Encounter (Signed)
Transition Care Management Follow-up Telephone Call   Date discharged -  9/12   How have you been since you were released from the hospital?  Doing great   Do you understand why you were in the hospital? Yes, asthma   Do you understand the discharge instructions?  Yes   Where were you discharged to?  Home   Items Reviewed:  Medications reviewed: Using albuterol  Inhaler during day as needed and albuterol neb at night.  Also using Symbicort and Prednisone.  Allergies reviewed:  No allergies  Dietary changes reviewed: No chagnes  Referrals reviewed: No referral at this time   Functional Questionnaire:   Activities of Daily Living (ADLs):   He states they are independent in the following: ambulation, bathing and hygiene, feeding, continence, grooming, toileting and dressing  Independent  States they require assistance with the following: none   Any transportation issues/concern ?  None   Any patient concerns? None   Confirmed importance and date/time of follow-up visits scheduled 01/24/15 @ 1045  Provider Appointment booked with Clinic  Confirmed with patient if condition begins to worsen call PCP or go to the ER.  Patient was given the office number and encouraged to call back with question or concerns.  He has our number.

## 2015-01-24 ENCOUNTER — Ambulatory Visit (INDEPENDENT_AMBULATORY_CARE_PROVIDER_SITE_OTHER): Payer: 59 | Admitting: Internal Medicine

## 2015-01-24 ENCOUNTER — Encounter: Payer: Self-pay | Admitting: Internal Medicine

## 2015-01-24 VITALS — BP 130/80 | HR 62 | Temp 97.8°F | Ht 66.0 in | Wt 200.0 lb

## 2015-01-24 DIAGNOSIS — Z23 Encounter for immunization: Secondary | ICD-10-CM | POA: Diagnosis not present

## 2015-01-24 DIAGNOSIS — E785 Hyperlipidemia, unspecified: Secondary | ICD-10-CM | POA: Diagnosis not present

## 2015-01-24 DIAGNOSIS — J4540 Moderate persistent asthma, uncomplicated: Secondary | ICD-10-CM

## 2015-01-24 MED ORDER — PRAVASTATIN SODIUM 40 MG PO TABS: 40.0000 mg | ORAL_TABLET | Freq: Every evening | ORAL | Status: DC

## 2015-01-24 NOTE — Assessment & Plan Note (Signed)
Lipid Panel     Component Value Date/Time   CHOL 236* 11/24/2014 0923   TRIG 163* 11/24/2014 0923   HDL 49 11/24/2014 0923   CHOLHDL 4.8 11/24/2014 0923   VLDL 33 11/24/2014 0923   LDLCALC 154* 11/24/2014 0923   Pt had elevated LDL on the visit in July and was concerned about the results.  Plan- I started him on pravastatin 40 mg daily and explained the side effects. Advised weight loss, diet and exercise.

## 2015-01-24 NOTE — Progress Notes (Signed)
Internal Medicine Clinic Attending  I saw and evaluated the patient.  I personally confirmed the key portions of the history and exam documented by Dr. Saraiya and I reviewed pertinent patient test results.  The assessment, diagnosis, and plan were formulated together and I agree with the documentation in the resident's note.  

## 2015-01-24 NOTE — Progress Notes (Signed)
Patient ID: Eean Buss, male   DOB: 1975-05-09, 39 y.o.   MRN: [id number]    Subjective:   Patient ID: Gertrude Tarbet male   DOB: [birth year] 39 y.o.   MRN: [id number]  HPI: Mr.Jager Birks is a 39 y.o. man who is here for a HFU regarding asthma exacerbation admission last week.  Other PMH below. Doing well overall. Compliant with symbicort.     Past Medical History  Diagnosis Date  . Bronchitis   . Seasonal allergies   . Asthma in adult 10/19/2014   Current Outpatient Prescriptions  Medication Sig Dispense Refill  . albuterol (PROAIR HFA) 108 (90 BASE) MCG/ACT inhaler Inhale 2-4 puffs into the lungs every 6 (six) hours as needed for wheezing or shortness of breath. 3.7 g 2  . albuterol (PROVENTIL) (2.5 MG/3ML) 0.083% nebulizer solution Take 3 mLs (2.5 mg total) by nebulization every 6 (six) hours as needed for wheezing or shortness of breath. 75 mL 12  . budesonide-formoterol (SYMBICORT) 80-4.5 MCG/ACT inhaler Inhale 2 puffs into the lungs 2 (two) times daily. 1 Inhaler 12  . fluticasone (FLONASE) 50 MCG/ACT nasal spray Place 2 sprays into both nostrils daily. 16 g 2  . guaiFENesin-dextromethorphan (ROBITUSSIN DM) 100-10 MG/5ML syrup Take 5 mLs by mouth every 4 (four) hours as needed for cough. 118 mL 0  . loratadine (CLARITIN) 10 MG tablet Take 1 tablet (10 mg total) by mouth daily. 30 tablet 2  . montelukast (SINGULAIR) 10 MG tablet Take 1 tablet (10 mg total) by mouth at bedtime. 30 tablet 2  . predniSONE (DELTASONE) 20 MG tablet Take 2 tablets (40 mg total) by mouth daily with breakfast. 10 tablet 0  . acetaminophen (TYLENOL) 325 MG tablet Take 2 tablets (650 mg total) by mouth every 6 (six) hours as needed for mild pain (or Fever >/= 101). (Patient not taking: Reported on 01/24/2015) 30 tablet 2  . pravastatin (PRAVACHOL) 40 MG tablet Take 1 tablet (40 mg total) by mouth every evening. 30 tablet 11   No current facility-administered medications for this  visit.   Family History  Problem Relation Age of Onset  . Diabetes Mother   . Diabetes Father    Social History   Social History  . Marital Status: Single    Spouse Name: N/A  . Number of Children: N/A  . Years of Education: N/A   Occupational History  . [employer]    Social History Main Topics  . Smoking status: Former Smoker    Types: Cigarettes  . Smokeless tobacco: None     Comment: Smoked 1-3 cigarettes on weekends for 6 years.  . Alcohol Use: 0.0 oz/week    0 Standard drinks or equivalent per week  . Drug Use: None  . Sexual Activity: Not Asked   Other Topics Concern  . None   Social History Narrative   Review of Systems: Review of Systems  Constitutional: Negative for fever, chills and malaise/fatigue.  HENT: Negative.   Respiratory: Negative.  Negative for cough, sputum production, shortness of breath and wheezing.   Cardiovascular: Negative for chest pain and palpitations.  Gastrointestinal: Negative for heartburn, nausea and abdominal pain.    Objective:  Physical Exam: Filed Vitals:   01/24/15 1103  BP: 130/80  Pulse: 62  Temp: 97.8 F (36.6 C)  TempSrc: Oral  Height:  (1.676 m)  Weight: 200 lb (90.719 kg)  SpO2: 99%   Physical Exam  Constitutional: He appears well-developed and well-nourished.  HENT:  Head: Normocephalic and  atraumatic.  Eyes: EOM are normal.  Neck: Normal range of motion. Neck supple.  Cardiovascular: Normal rate, regular rhythm, normal heart sounds and intact distal pulses.   No murmur heard. Pulmonary/Chest: Effort normal and breath sounds normal. No respiratory distress. He has no wheezes.  Abdominal: Soft. Bowel sounds are normal. He exhibits no distension. There is no tenderness.  Skin: Skin is warm.    Assessment & Plan:   Please see problem based charting for assessment and plan

## 2015-01-24 NOTE — Patient Instructions (Signed)
Thank you for your visit today. Please continue to use your symbicort twice a day, and albuterol as needed Please call the clinic ahead of time if you run out Please start the cholesterol medicine Please return to clinic in 3 months, or earlier if you have other concerns Bryan Barr (Asthma) [name] es una enfermedad recurrente en la que las vas respiratorias se Engineer, technical sales y [place]. Puede causar dificultad para respirar. Provoca tos, sibilancias y sensacin de falta de aire. Los episodios de Bryan Barr, tambin llamados crisis de Bryan Barr, pueden ser leves o potencialmente mortales. [name] no puede curarse, pero los [employer] y los cambios en el estilo de vida lo ayudarn a Scientist, physiological enfermedad. CAUSAS Se cree que la causa del Bryan Barr son factores hereditarios (genticos) y la exposicin a factores ambientales; sin embargo, su causa exacta se desconoce. [name] generalmente es desencadenada por alrgenos, infecciones en los pulmones o sustancias irritantes que se encuentran en el aire. Los desencadenantes del Bryan Barr son diferentes para cada persona. Los factores desencadenantes comunes incluyen:   Caspa de los [place].  caros del polvo.  [place].  El polen de los rboles o el csped.  Moho.  Humo.  Sustancias contaminantes como el polvo, limpiadores del hogar, sprays para el cabello, aerosoles, vapores de pintura, sustancias qumicas fuertes u olores intensos.  El [place] fro, los cambios de Marketing executive y el viento (que aumenta la cantidad de moho y polen en el aire).  Emociones intensas, como llorar o rer [employer].  Estrs.  Ciertos medicamentos (como la aspirina) o algunos frmacos (como los betabloqueantes).  Los sulfitos que contienen los alimentos y las bebidas. Los alimentos y bebidas que pueden contener sulfitos son las frutas desecadas, las papas fritas y los vinos espumantes.  Enfermedades infecciosas o inflamatorias, como la gripe, el resfro o la inflamacin de las membranas  nasales (rinitis).  El reflujo gastroesofgico (ERGE).  Los ejercicios o actividades extenuantes. SNTOMAS Los sntomas pueden ocurrir inmediatamente despus de que se desencadena [name] o muchas horas ms tarde. Los sntomas son:  Sibilancias.  Tos excesiva durante la noche o temprano por la maana.  Tos frecuente o intensa durante un resfro comn.  Opresin en el pecho.  Falta de aire. DIAGNSTICO  El diagnstico se realiza mediante la revisin de su historia clnica y de un examen fsico. Es posible que le indiquen algunos estudios. Estos pueden incluir:  Estudios de la funcin pulmonar. Estas pruebas indican cunto aire inhala y exhala.  Pruebas de alergia.  Estudios de diagnstico por imgenes, como radiografas. TRATAMIENTO  [name] no puede curarse, pero puede controlarse. El Brewing technologist identificar y Automotive engineer los factores desencadenantes del Bryan Barr. Tambin incluye medicamentos. Hay dos tipos de medicamentos utilizados en el tratamiento para [name]:   Medicamentos de control del Bryan Barr. Impiden que aparezcan los sntomas. Generalmente se [employer].  Medicamentos de [place] o de rescate. Alivian los sntomas rpidamente. Se utilizan cuando es necesario y proporcionan alivio a Product manager. El mdico lo ayudar a Runner, broadcasting/film/video de accin para [name], que es una planificacin por escrito para el control y el tratamiento de las crisis de Bryan Barr. Incluye una lista de los factores desencadenantes y el modo en que pueden evitarse. Tambin incluye informacin acerca del momento en que se deben USAA y cundo se debe cambiar la dosis. Un plan de accin tambin incluye el uso de un dispositivo llamado espirmetro. El espirmetro es un dispositivo que mide el funcionamiento de los pulmones. Lo  ayuda a Estate manager/land agent. INSTRUCCIONES PARA EL CUIDADO EN EL HOGAR   Tome los medicamentos solamente como se lo haya indicado el mdico. Hable con el  mdico si tiene preguntas acerca de cmo o cundo tomar los medicamentos.  Use un espirmetro de acuerdo con las indicaciones del mdico. Anote y Audiological scientist un registro de los [place].  Conozca y [employer] de accin para ayudar a minimizar o detener una crisis de Bryan Barr sin necesidad de buscar atencin mdica.  Controle el ambiente de su hogar de la siguiente manera para prevenir las crisis de Bryan Barr:  No fume. Evite la exposicin al humo de otros fumadores.  Cambie regularmente el filtro de la calefaccin y del aire acondicionado.  Limite el uso de hogares o estufas a lea.  Elimine las plagas (como cucarachas, ratones) y sus excrementos.  Elimine las plantas si observa moho en ellas.  Limpie habitualmente los pisos y el polvo. Utilice productos sin perfume.  Intente que otra persona pase la aspiradora con regularidad. Permanezca fuera de las habitaciones mientras son aspiradas y por algn tiempo despus. Si usted pasa la Jaclyn [place], use una mscara para polvo de las que se consiguen en la [place], una bolsa de aspiradora de doble capa o microfiltro o una aspiradora con un filtro HEPA.  Reemplace las alfombras por pisos de [place], baldosas o vinilo. Las alfombras pueden retener la caspa de los animales y [place].  Use almohadas, mantas y cubre colchones antialrgicos.  Lave las sbanas y las mantas todas las semanas con agua caliente y squelas con aire caliente.  Use mantas de polister o algodn.  Limpie baos y cocinas con lavandina. Si fuera posible, pdale a alguien que vuelva a pintar las paredes de estas habitaciones con [place] Dear pintura resistente a los hongos. Aljese de las habitaciones que se estn limpiando y pintando.  Lvese las manos con frecuencia. SOLICITE ATENCIN MDICA SI:   Respira con dificultad an cuando toma el medicamento para prevenir las crisis.  La mucosidad coloreada que expectora cuando tose (esputo) es ms espesa que lo habitual.  Su esputo cambia  de un color claro o blanco a un color amarillo, verde, gris o sanguinolento.  Tiene trastornos ocasionados por el medicamento que est tomando (como urticaria, picazn, hinchazn o dificultades respiratorias).  Necesita un medicamento aliviador ms de 2 o 3 veces por semana.  Su flujo espiratorio mximo se mantiene entre el 50% y el 79% de su Arts administrator personal, despus de seguir el plan de accin durante 1hora.  Tiene fiebre. SOLICITE ATENCIN MDICA DE INMEDIATO SI:   Usted parece empeorar y no responde al tratamiento durante una crisis de Bryan Barr.  Le falta el aire, incluso en reposo.  Le falta el aire an cuando hace muy poca actividad fsica.  Tiene dificultad para comer, beber o hablar debido a los sntomas del Bryan Barr.  Siente dolor en el pecho.  Se le acelera la frecuencia cardaca.  Tiene los labios o las uas de tono [place].  Siente que est por desvanecerse, est mareado o se desmaya.  Su flujo mximo es Adult nurse del 50% del Arts administrator personal. ASEGRESE DE QUE:   Comprende estas instrucciones.  Controlar su afeccin.  Recibir ayuda de inmediato si no mejora o si empeora. Document Released: 04/23/2005 Document Revised: 09/07/2013 [facility] Patient Information 2015 [place], [place]. This information is not intended to replace advice given to you by your health care provider. Make sure you discuss any questions you have with your health care provider.

## 2015-01-24 NOTE — Assessment & Plan Note (Signed)
Pt has mild/moderate persistent asthma and was hospitalized for asthma exacerbation last week. He finished a 5 day course of 40 mg prednisone. He had PFTs done in 12/2014 which showed FEV1/FVC ratio of 42% with FEV1 of 37%. He is doing well overall, he only had to use the albuterol twice since he was discharged from the hospital. He does not smoke. He denies SOB, or wheezing,   A: Exam unremarkable, pt satting 99% on room air, no wheezing heard b/l.   Plan Continue symbicort, montelukast, rescue albuterol inhaler I reiterated to him that he needs to take the symbicort seriously as these are 2 hospital admissions, and he needs to call ahead of time for refills.

## 2015-12-13 ENCOUNTER — Encounter: Payer: Self-pay | Admitting: Internal Medicine

## 2016-02-10 ENCOUNTER — Other Ambulatory Visit: Payer: Self-pay | Admitting: *Deleted

## 2016-02-10 MED ORDER — BUDESONIDE-FORMOTEROL FUMARATE 80-4.5 MCG/ACT IN AERO: 2.0000 | INHALATION_SPRAY | Freq: Two times a day (BID) | RESPIRATORY_TRACT | 0 refills | Status: DC

## 2016-02-10 NOTE — Telephone Encounter (Signed)
Called pt - spoke to his wife who speak some English - informed her needs an appt to continue refills. Last appt was 01/2015. Said she will talk to him and call back later.

## 2016-02-19 IMAGING — CT CT CHEST HIGH RESOLUTION W/O CM
2 of 6 series · 11 of 36 positions shown, 13 images · non-contrast
Comparison: No priors.

CLINICAL DATA: 38-year-old male with shortness of breath and
wheezing since yesterday. No response to breathing treatments.

EXAM:
CT CHEST WITHOUT CONTRAST
TECHNIQUE: Multidetector CT imaging of the chest was performed following the
standard protocol without intravenous contrast. High resolution
imaging of the lungs, as well as inspiratory and expiratory imaging,
was performed.

[Series 2: high resolution 5.0 b40f · axial · 0.67mm/px · z∈[+777,+1002]mm · 8 of 59 slices shown, 10 images]
[im 7/59  mediastinal]
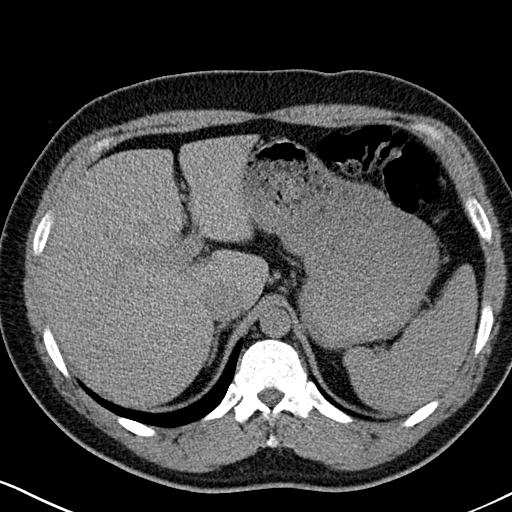
[im 7/59  lung]
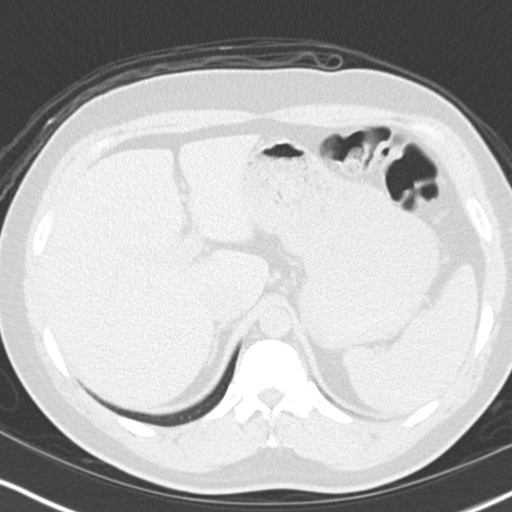
[im 13/59  lung]
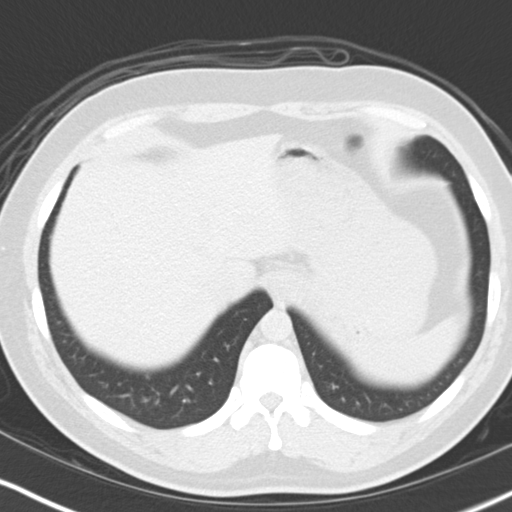
[im 20/59  lung]
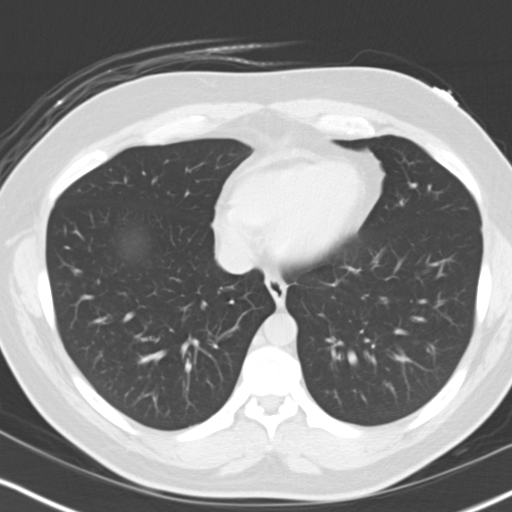
[im 26/59  lung]
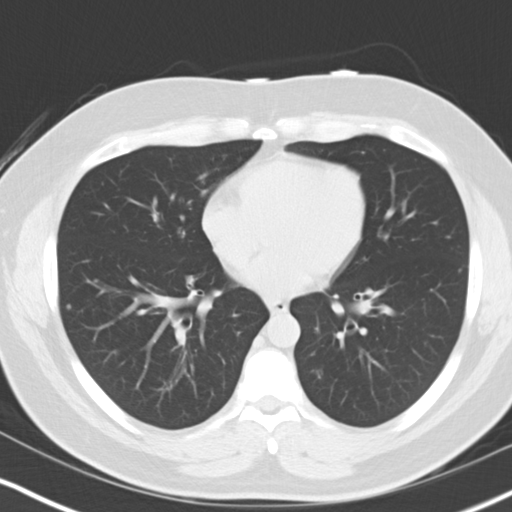
[im 33/59  mediastinal]
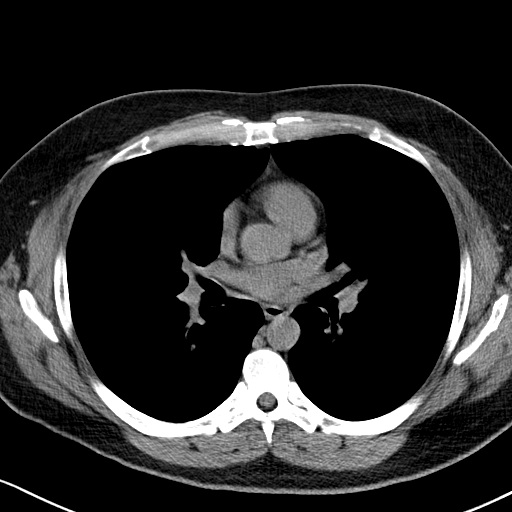
[im 33/59  lung]
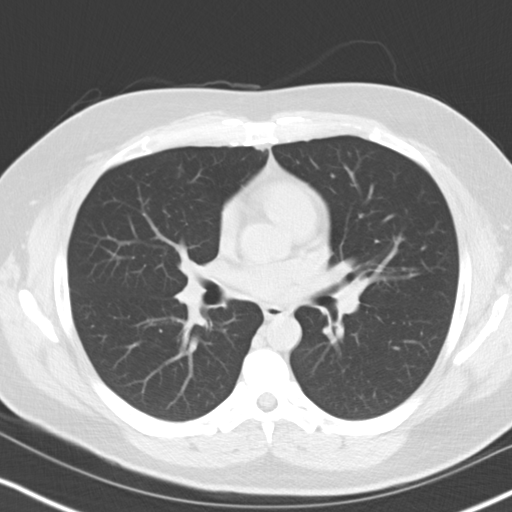
[im 39/59  lung]
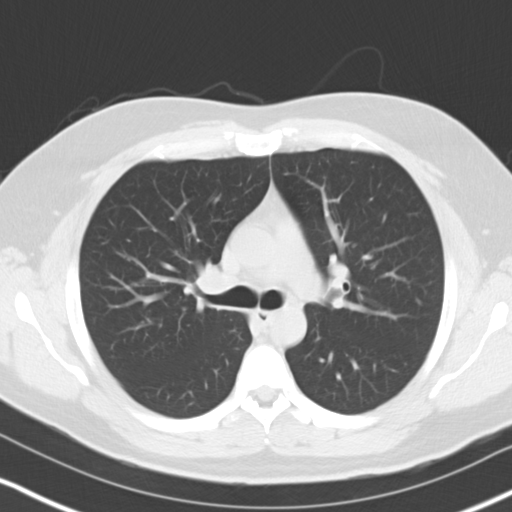
[im 46/59  lung]
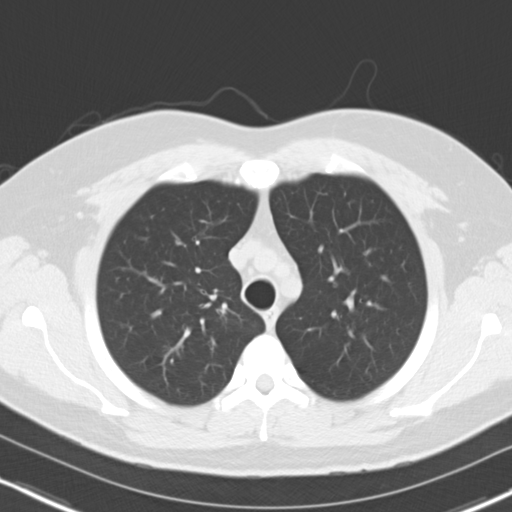
[im 52/59  lung]
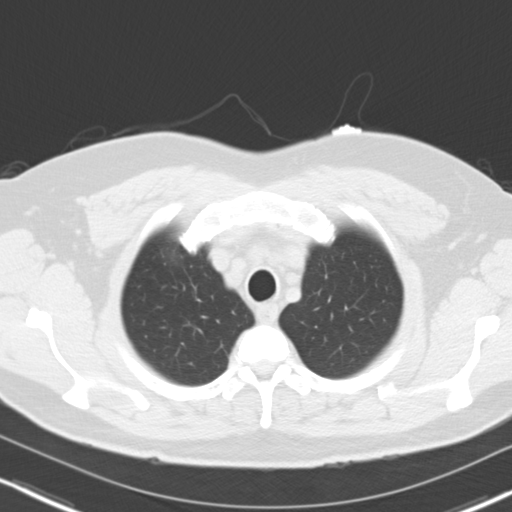

[Series 8: coronal · coronal · 0.60mm/px · 3 of 87 slices shown]
[im 18/87  lung]
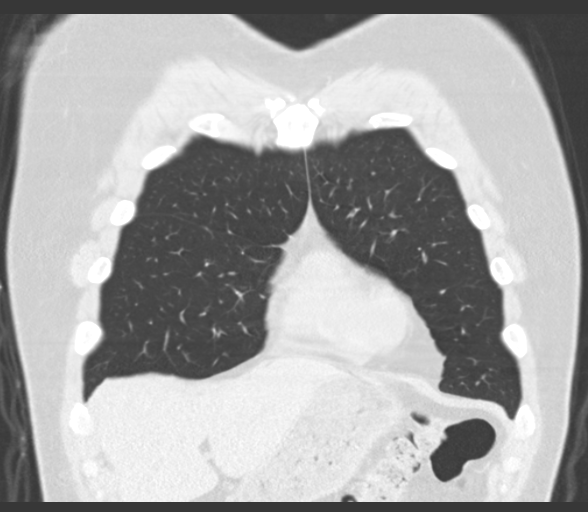
[im 35/87  lung]
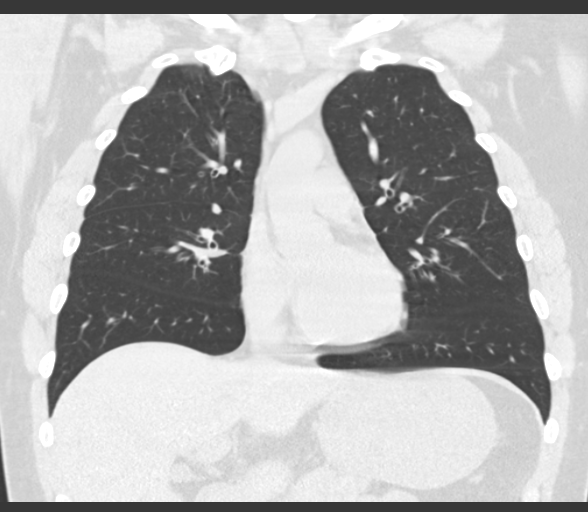
[im 52/87  lung]
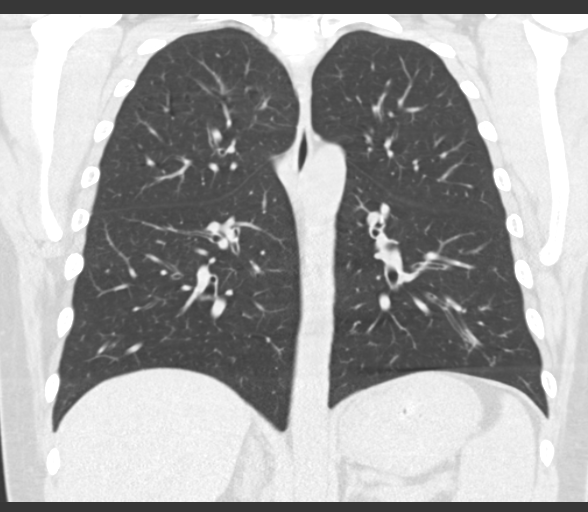

[11 of 36 positions shown; findings below may reference images not displayed]

FINDINGS: Mediastinum/Lymph Nodes: Heart size is normal. There is no
significant pericardial fluid, thickening or pericardial
calcification. No pathologically enlarged mediastinal or hilar lymph
nodes. Please note that accurate exclusion of hilar adenopathy is
limited on noncontrast CT scans. Esophagus is unremarkable in
appearance. No axillary lymphadenopathy.

Lungs/Pleura: High-resolution images demonstrate diffuse bronchial
wall thickening. No significant areas of ground-glass attenuation,
subpleural reticulation, parenchymal banding, traction
bronchiectasis or frank honeycombing are noted. Inspiratory and
expiratory imaging is limited by considerable patient respiratory
motion, but is generally unremarkable. Several tiny pulmonary
nodules are seen scattered throughout the lungs bilaterally, the
largest of which measures only 3 mm in the right lower lobe (image
33 of series 5). No larger more suspicious appearing pulmonary
nodules or masses are noted. No acute consolidative airspace
disease. No pleural effusions.

Upper Abdomen: Unremarkable.

Musculoskeletal/Soft Tissues: There are no aggressive appearing
lytic or blastic lesions noted in the visualized portions of the
skeleton.
IMPRESSION: 1. No findings to suggest interstitial lung disease.
2. Mild diffuse bronchial wall thickening. This could be seen in the
setting of reactive airway disease or acute bronchitis.
3. Several tiny pulmonary nodules scattered throughout the lungs
bilaterally measuring up to only 3 mm in the right lower lobe (image
33 of series 5). Statistically, these are likely benign in this
young patient. If the patient is at high risk for bronchogenic
carcinoma, follow-up chest CT at 1 year is recommended. If the
patient is at low risk, no follow-up is needed. This recommendation
follows the consensus statement: Guidelines for Management of Small
Pulmonary Nodules Detected on CT Scans: A Statement from the

## 2016-05-19 IMAGING — CR DG CHEST 1V PORT
1 series · 1 of 1 positions shown · non-contrast
Comparison: CT dated 10/18/2014

CLINICAL DATA: 39-year-old male with shortness of breath history of
bronchitis

EXAM:
PORTABLE CHEST - 1 VIEW

[AP]
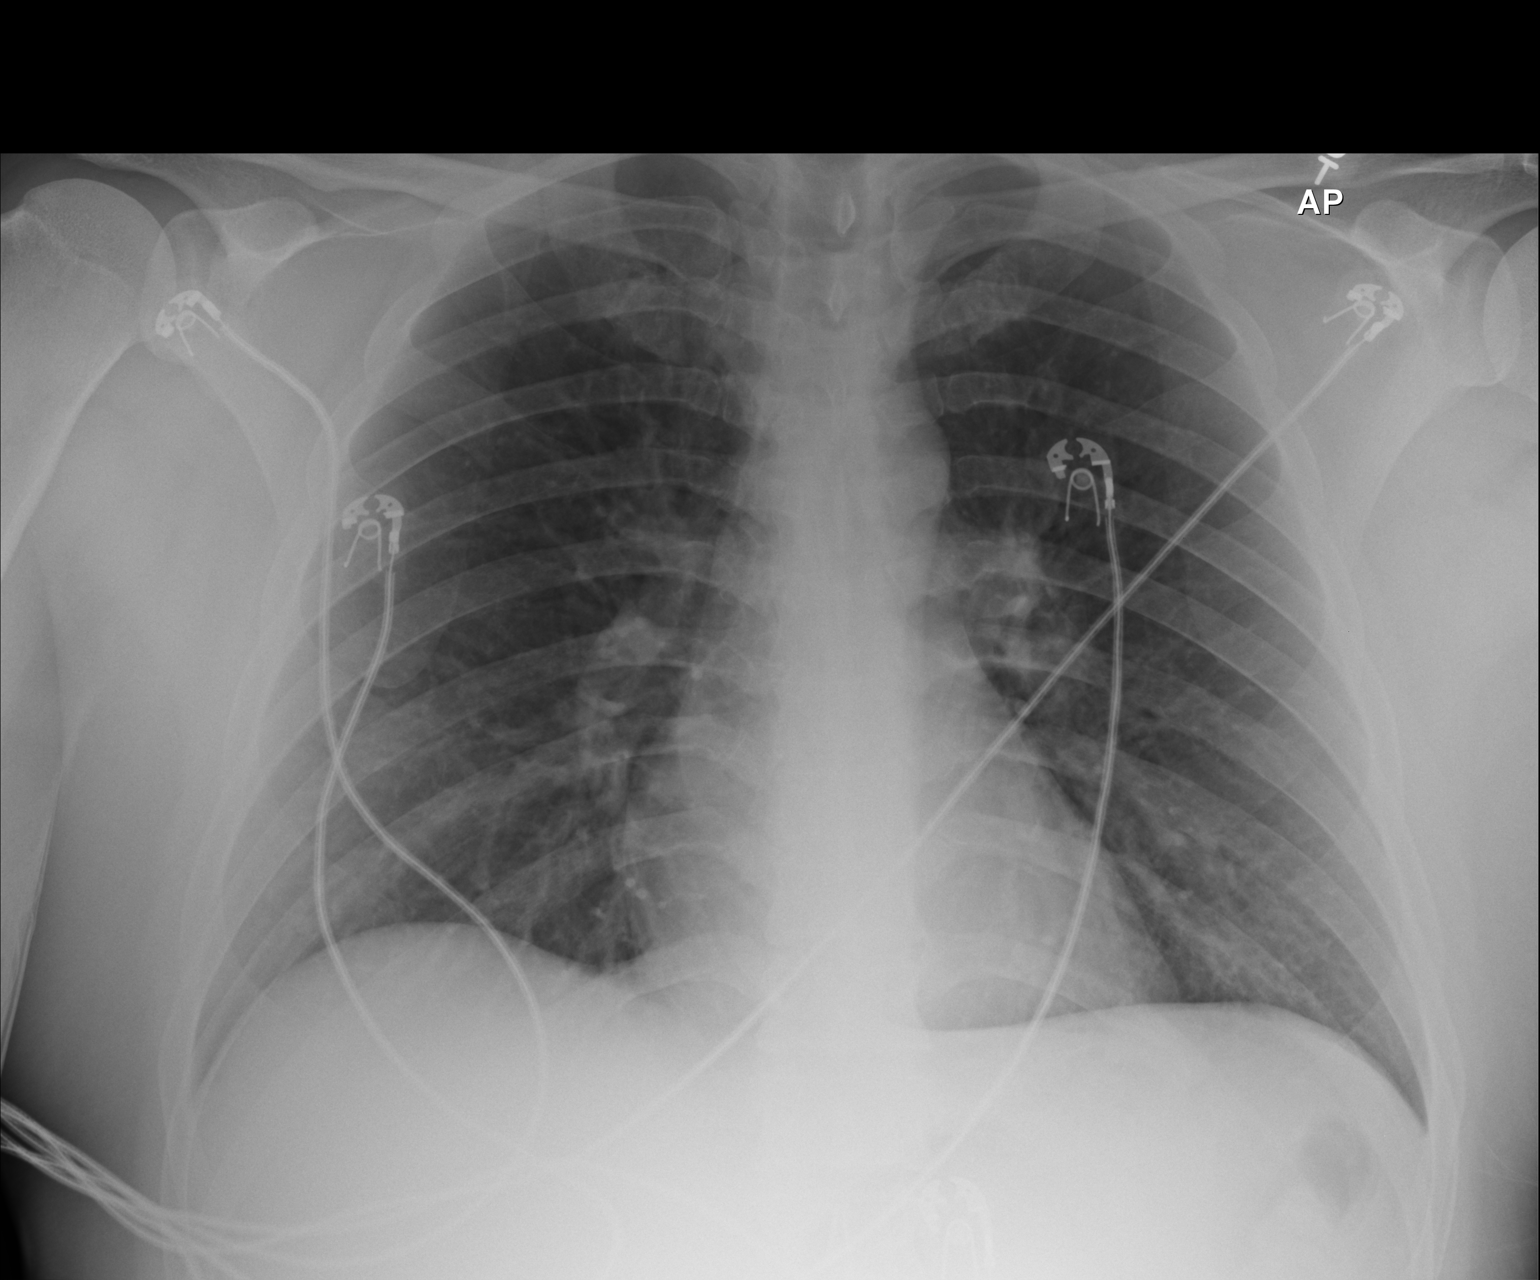

[1 of 1 positions shown; findings below may reference images not displayed]

FINDINGS: The heart size and mediastinal contours are within normal limits.
Both lungs are clear. The visualized skeletal structures are
unremarkable.
IMPRESSION: No active disease.

## 2016-06-09 DIAGNOSIS — Z23 Encounter for immunization: Secondary | ICD-10-CM | POA: Diagnosis not present

## 2016-06-13 ENCOUNTER — Encounter: Payer: Self-pay | Admitting: Internal Medicine

## 2016-06-13 ENCOUNTER — Ambulatory Visit (INDEPENDENT_AMBULATORY_CARE_PROVIDER_SITE_OTHER): Payer: Self-pay | Admitting: Internal Medicine

## 2016-06-13 VITALS — BP 134/85 | HR 113 | Temp 98.1°F | Wt 203.4 lb

## 2016-06-13 DIAGNOSIS — R51 Headache: Secondary | ICD-10-CM

## 2016-06-13 DIAGNOSIS — Z20828 Contact with and (suspected) exposure to other viral communicable diseases: Secondary | ICD-10-CM

## 2016-06-13 DIAGNOSIS — R05 Cough: Secondary | ICD-10-CM

## 2016-06-13 DIAGNOSIS — J45909 Unspecified asthma, uncomplicated: Secondary | ICD-10-CM

## 2016-06-13 DIAGNOSIS — J309 Allergic rhinitis, unspecified: Secondary | ICD-10-CM

## 2016-06-13 DIAGNOSIS — Z87891 Personal history of nicotine dependence: Secondary | ICD-10-CM

## 2016-06-13 DIAGNOSIS — M791 Myalgia: Secondary | ICD-10-CM

## 2016-06-13 DIAGNOSIS — J029 Acute pharyngitis, unspecified: Secondary | ICD-10-CM

## 2016-06-13 DIAGNOSIS — R6889 Other general symptoms and signs: Secondary | ICD-10-CM | POA: Insufficient documentation

## 2016-06-13 DIAGNOSIS — J4520 Mild intermittent asthma, uncomplicated: Secondary | ICD-10-CM

## 2016-06-13 MED ORDER — OSELTAMIVIR PHOSPHATE 75 MG PO CAPS: 75.0000 mg | ORAL_CAPSULE | Freq: Two times a day (BID) | ORAL | 0 refills | Status: AC

## 2016-06-13 NOTE — Assessment & Plan Note (Signed)
Compliant with his inhalers although reports he takes Singular several times a day and albuterol occasionally. Counseled patient on proper technique and proper dosing regimen for the above. Agreed with plan.  -Symbicort 2 puffs twice daily -Albuterol as needed --- emphasized that THIS is the as needed medication, and the one he should be using if wheezing were to worsen

## 2016-06-13 NOTE — Assessment & Plan Note (Signed)
Reports compliance with Claritin and flonase, which will be continued.

## 2016-06-13 NOTE — Progress Notes (Signed)
   CC: cough, sore throat.  HPI:  Bryan Barr is a 41 y.o. M who presents for evaluation of cough and sore throat. He is spanish-speaking and a Nurse, learning disabilitytranslator was used during examination. The patient reports developing a sore throat on Monday of this week, followed by a dry cough, myalgias and headache. Reports a sick contact at home, namely his eldest son who was diagnosed with and treated for the Flu on Saturday. Has been using his inhalers at home without significant relief although does report that flonase seems to help. Denies any chills, abdominal pain, diarrhea, chest pain or shortness of breath.   He wishes to have a physical completed soon as well.   Past Medical History:  Diagnosis Date  . Asthma in adult 10/19/2014  . Bronchitis   . Seasonal allergies     Review of Systems:  Review of Systems  Constitutional: Positive for malaise/fatigue. Negative for chills, diaphoresis and fever.  HENT: Positive for sore throat. Negative for ear pain and sinus pain.   Eyes: Negative for blurred vision and double vision.  Respiratory: Positive for cough. Negative for sputum production and shortness of breath.   Cardiovascular: Negative for chest pain and orthopnea.  Gastrointestinal: Negative for abdominal pain, diarrhea, nausea and vomiting.  Musculoskeletal: Positive for myalgias. Negative for joint pain.  Neurological: Positive for headaches.   Physical Exam: Physical Exam  Constitutional: He appears well-developed and well-nourished. No distress.  HENT:  Mouth/Throat: Oropharynx is clear and moist. No oropharyngeal exudate.  Eyes: Conjunctivae are normal. No scleral icterus.  Cardiovascular: Regular rhythm and normal heart sounds.   Tachycardic, rate 110.   Pulmonary/Chest: Effort normal. No respiratory distress. He has wheezes (faint end exp). He has no rales.  Abdominal: Soft. Bowel sounds are normal. He exhibits no distension. There is no tenderness.  Skin: Skin is warm  and dry. He is not diaphoretic.    Vitals:   06/13/16 0839  BP: 134/85  Pulse: (!) 113  Temp: 98.1 F (36.7 C)  TempSrc: Oral  SpO2: 97%  Weight: 203 lb 6.4 oz (92.3 kg)    Assessment & Plan:   See Encounters Tab for problem based charting.  Patient discussed with Dr. Heide SparkNarendra

## 2016-06-13 NOTE — Assessment & Plan Note (Signed)
Current symptoms consistent with viral URI and there is concern with flu with his recent sick contact in home with the flu. Lungs are clear except for faint exp wheezes. No sinus tenderness. Doubt secondary bacterial infection at this point.  -Treat with 5 day course of Tamiflu -Conservative management encouraged with tylenol, ibuprofen, increased fluids, salt-water gargles for sore throat and sinus irrigation for congestion -Encouraged to return in 1-2 weeks if symptoms have not improved -Also, encouraged patient to schedule an appointment with his PCP for physical exam

## 2016-06-13 NOTE — Patient Instructions (Signed)
  Fue genial conocerlo hoy!  1. Lamento que hoy no te sientas bien. Creo que sus sntomas son consistentes con Emergency planning/management officerla gripe. Para esto, te estoy recetando un medicamento llamado Tamiflu. Tome The PNC Financialesto dos veces al da durante los prximos 5 das. 2. Adems, puede tomar Tylenol o Ibuprofen para la fiebre o dolores en el cuerpo. Aumente su ingesta de lquidos tambin. Para su dolor de garganta, puede hacer grgaras con agua salada. 3. Vuelva y veanos si no se siente mejor en 2 semanas. 4. Adems, programe una cita en la recepcin para un examen fsico.  It was great meeting you today!   1. I'm sorry you arent feeling well today. I believe your symptoms are consistent with the flu. For this I am prescribing you a medication called Tamiflu. Take this twice daily for the next 5 days.  2. In addition, you can take Tylenol or Ibuprofen for fever or body aches. Increase your fluid intake as well. For your sore throat, you can gargle with salt water.  3. Please come back and see Bryan Barr if you are not feeling better in 2 weeks.  4. Also, please schedule an appointment at the front desk for a physical.

## 2016-06-20 NOTE — Progress Notes (Signed)
Internal Medicine Clinic Attending  Case discussed with Dr. Molt at the time of the visit.  We reviewed the resident's history and exam and pertinent patient test results.  I agree with the assessment, diagnosis, and plan of care documented in the resident's note. 

## 2016-07-23 ENCOUNTER — Ambulatory Visit (INDEPENDENT_AMBULATORY_CARE_PROVIDER_SITE_OTHER): Payer: BLUE CROSS/BLUE SHIELD | Admitting: Internal Medicine

## 2016-07-23 VITALS — BP 145/95 | HR 85 | Temp 98.5°F | Ht 66.0 in | Wt 202.7 lb

## 2016-07-23 DIAGNOSIS — Z7951 Long term (current) use of inhaled steroids: Secondary | ICD-10-CM | POA: Diagnosis not present

## 2016-07-23 DIAGNOSIS — Z87891 Personal history of nicotine dependence: Secondary | ICD-10-CM

## 2016-07-23 DIAGNOSIS — Z8639 Personal history of other endocrine, nutritional and metabolic disease: Secondary | ICD-10-CM

## 2016-07-23 DIAGNOSIS — J45909 Unspecified asthma, uncomplicated: Secondary | ICD-10-CM | POA: Diagnosis not present

## 2016-07-23 DIAGNOSIS — R030 Elevated blood-pressure reading, without diagnosis of hypertension: Secondary | ICD-10-CM

## 2016-07-23 DIAGNOSIS — Z0000 Encounter for general adult medical examination without abnormal findings: Secondary | ICD-10-CM

## 2016-07-23 LAB — POCT GLYCOSYLATED HEMOGLOBIN (HGB A1C): Hemoglobin A1C: 5.8

## 2016-07-23 LAB — GLUCOSE, CAPILLARY: GLUCOSE-CAPILLARY: 96 mg/dL (ref 65–99)

## 2016-07-23 NOTE — Progress Notes (Signed)
   CC: health maintenance  HPI:  Mr.Jaaziel Kizzie FantasiaSarabia-Alonso is a [age] y.o. male with past medical history outlined below here for routine follow-up. Patient is doing well today and has no complaints. History was obtained with the help of an interpreter. He reports compliance with all his medications except for his statin. He is unaware of this medication was for. For his asthma he's been using Symbicort twice a day and albuterol as needed. He reports that his breathing has been well controlled. He says that he uses his albuterol inhaler up to 10 times a week but not daily. He says on a good day he does not need to use it. For the details of today's visit, please refer to the assessment and plan.  Past Medical History:  Diagnosis Date  . Asthma in adult 10/19/2014  . Bronchitis   . Seasonal allergies     Review of Systems:  All pertinents listed in HPI, otherwise negative  Physical Exam:  Vitals:   07/23/16 1608 07/23/16 1633  BP: (!) 148/91 (!) 145/95  Pulse: 91 85  Temp: 98.5 F (36.9 C)   TempSrc: Oral   SpO2: 98%   Weight: 202 lb 11.2 oz (91.9 kg)   Height: 5\' 6"  (1.676 m)     Constitutional: NAD, appears comfortable HEENT: Atraumatic, normocephalic. PERRL, anicteric sclera.  Cardiovascular: RRR  Pulmonary/Chest: CTAB Abdominal: Soft, non tender, non distended. +BS.  Extremities: Warm and well perfused. No edema.  Neurological: A&Ox3, CN II - XII grossly intact.   Assessment & Plan:   See Encounters Tab for problem based charting.  Patient discussed with Dr. Rogelia BogaButcher

## 2016-07-23 NOTE — Assessment & Plan Note (Addendum)
Patient is here today for healthcare maintenance and routine follow-up. He feels well and has no complaints. He had a lipid panel completed 2015 with elevated total cholesterol and LDL. He was previously prescribed pravastatin but reports he is not taking it. He was unaware of this medication or what it was for. His 10 year ASCVD risk calculated today is only 2.1%. Given his low risk we will not restart pravastatin and plan to recheck a recheck lipid panel in 3 years. Blood pressure was elevated today 148/91 and persistently elevated on recheck 145/95. On chart review it appears he has been normotensive at prior visits. Plan to follow-up in one month for blood pressure recheck. We will check an A1c today given his elevated glucose on prior BMPs. -- A1C  -- F/u in 1 month for BP -- If normal, can follow up in 1 year

## 2016-07-23 NOTE — Progress Notes (Signed)
Internal Medicine Clinic Attending  Case discussed with Dr. Antony ContrasGuilloud at the time of the visit.  We reviewed the resident's history and exam and pertinent patient test results.  I agree with the assessment, diagnosis, and plan of care documented in the resident's note. A1C today is 5.8.

## 2016-07-23 NOTE — Patient Instructions (Addendum)
[name],  Fue un placer verte hoy. Me alegra que ests bien. Te llamar con los resultados de tu anlisis de [place]. Contine tomando todos sus medicamentos segn lo recetado previamente. Su presin arterial se elev hoy. Realice un seguimiento en 1 mes para que se vuelva a revisar la presin arterial o antes si nos necesita. Si tiene alguna pregunta o inquietud, llame a Lawana Chambersnuestra clnica al [phone] o despus del horario de atencin llame al [phone] y pregunte por el residente de medicina interna de turno. Gracias!    Mr. Bryan Barr,  It was a pleasure seeing you today. I am glad you are doing well. I will call you with the results of your blood work. Please continue to take all of your medicine as previously prescribed. Your blood pressure was elevated today. Please follow up in 1 month to have your blood pressure rechecked or sooner if you need us. If you have any questions or concerns, call our clinic at [phone] or after hours call [phone] and ask for the internal medicine resident on call. Thank you!

## 2016-08-27 ENCOUNTER — Ambulatory Visit (INDEPENDENT_AMBULATORY_CARE_PROVIDER_SITE_OTHER): Payer: BLUE CROSS/BLUE SHIELD | Admitting: Internal Medicine

## 2016-08-27 VITALS — BP 136/89 | HR 89 | Temp 97.9°F | Ht 66.0 in | Wt 203.9 lb

## 2016-08-27 DIAGNOSIS — J4540 Moderate persistent asthma, uncomplicated: Secondary | ICD-10-CM | POA: Diagnosis not present

## 2016-08-27 DIAGNOSIS — Z7289 Other problems related to lifestyle: Secondary | ICD-10-CM | POA: Diagnosis not present

## 2016-08-27 DIAGNOSIS — F1090 Alcohol use, unspecified, uncomplicated: Secondary | ICD-10-CM

## 2016-08-27 DIAGNOSIS — R030 Elevated blood-pressure reading, without diagnosis of hypertension: Secondary | ICD-10-CM

## 2016-08-27 DIAGNOSIS — Z789 Other specified health status: Secondary | ICD-10-CM

## 2016-08-27 DIAGNOSIS — Z87891 Personal history of nicotine dependence: Secondary | ICD-10-CM | POA: Diagnosis not present

## 2016-08-27 DIAGNOSIS — Z0000 Encounter for general adult medical examination without abnormal findings: Secondary | ICD-10-CM

## 2016-08-27 MED ORDER — BUDESONIDE-FORMOTEROL FUMARATE 80-4.5 MCG/ACT IN AERO: 3.0000 | INHALATION_SPRAY | Freq: Two times a day (BID) | RESPIRATORY_TRACT | 2 refills | Status: DC

## 2016-08-27 MED ORDER — ALBUTEROL SULFATE HFA 108 (90 BASE) MCG/ACT IN AERS: 2.0000 | INHALATION_SPRAY | Freq: Four times a day (QID) | RESPIRATORY_TRACT | 2 refills | Status: DC | PRN

## 2016-08-27 MED ORDER — BUDESONIDE-FORMOTEROL FUMARATE 80-4.5 MCG/ACT IN AERO: 2.0000 | INHALATION_SPRAY | Freq: Two times a day (BID) | RESPIRATORY_TRACT | 2 refills | Status: DC

## 2016-08-27 MED ORDER — FLUTICASONE-SALMETEROL 250-50 MCG/DOSE IN AEPB: 1.0000 | INHALATION_SPRAY | Freq: Two times a day (BID) | RESPIRATORY_TRACT | 2 refills | Status: DC

## 2016-08-27 NOTE — Progress Notes (Signed)
    CC: Moderate persistent asthma, HM, alcohol use, elevated blood pressure, obesity,  HPI: [name] is a 41 y.o. man with PMH noted below here for Moderate persistent asthma, HM, alcohol use, elevated blood pressure, obesity,   Please see Problem List/A&P for the status of the patient's chronic medical problems   Past Medical History:  Diagnosis Date  . Asthma in adult 10/19/2014  . Bronchitis   . Seasonal allergies     Review of Systems: Denies fevers, chills, weight loss or weight gain or fatigue Denies cough, SOB or wheezing. Denies n,v,d,c. Denies headaches   Physical Exam: Vitals:   08/27/16 1419  BP: 136/89  Pulse: 89  Temp: 97.9 F (36.6 C)  TempSrc: Oral  SpO2: 100%  Weight: 203 lb 14.4 oz (92.5 kg)  Height:  (1.676 m)    General: A&O, in NAD Neck: supple, midline trachea,  CV: RRR, normal s1, s2, no m/r/g, Resp: equal and symmetric breath sounds, no wheezin Abdomen: soft, nontender, nondistended, +BS Extremities: no edema   Assessment & Plan:   See encounters tab for problem based medical decision making. Patient discussed with Dr. Rogelia Boga

## 2016-08-27 NOTE — Patient Instructions (Addendum)
Thank you for your visit today  Please keep track of your blood pressure at home or where you can have it checked. Please bring the log with you at next visit   Please increase your Symbicort to 3 puffs twice a day.  Ideally, this should reduce your albuterol use (above instruction changed and pt informed by voicemail- changed to Advair)  Please work on your exercise- 30 minutes 4-5 times a week, and also eat more healthy  Please cut down your alcohol use.   Please follow up in 1 month

## 2016-08-27 NOTE — Assessment & Plan Note (Signed)
BP Readings from Last 3 Encounters:  08/27/16 136/89  07/23/16 (!) 145/95  06/13/16 134/85   Patient's blood pressure was 136/89 today and on last visit it was also elevated. He does not have a diagnosis of HTN. However, would like him to obtain several ABPM before the diagnosis and potential therapy.   Plan -ambulatory blood pressure monitoring on the log given Explained to go to pharmacy or [facility] for readings and given his a log. Explained low salt intake, regular exercise, and change in diet. Explained decreasing alcohol intake. He does not smoke currently.  -follow up in 1 month

## 2016-08-27 NOTE — Assessment & Plan Note (Signed)
A1c 5.8 checked at last visit

## 2016-08-27 NOTE — Assessment & Plan Note (Addendum)
Patient currently uses albuterol once a day. It was unclear whether he uses symbicort one puff BID or 2 puffs BID. I asked him even with the interpreter and he gave me different answers. But given that he is requiring albuterol daily, this will need a step up in therapy to medium dose ICS plus a LABA (step 4)  Plan -Changing the inhaler to Advair-diskus 250 mcg-50 mcg 1 inhalation BID. This is also in patient's preferred formulary.  -albuterol PRN -I have informed the pharmacy and left voicemail at pt's number to inform of this change.   -albuterol PRN  - RTC in 1 month and periodically to re-assess symptoms

## 2016-08-27 NOTE — Assessment & Plan Note (Signed)
Patient has been drinking 2-3 12 oz beers per day (cerveza) , and in the weekend, he drinks 12-14 beers per day.  Patient understands that this is excessive alcohol intake, and has insight into this. He denies wanting any intervention currently.   Plan -continue to monitor -he may be a candidate for naltrexone if he continues to have excessive alcohol drinking

## 2016-09-03 NOTE — Progress Notes (Signed)
Internal Medicine Clinic Attending  Case discussed with Dr. Saraiya at the time of the visit.  We reviewed the resident's history and exam and pertinent patient test results.  I agree with the assessment, diagnosis, and plan of care documented in the resident's note.  

## 2016-09-26 ENCOUNTER — Encounter: Payer: Self-pay | Admitting: Internal Medicine

## 2016-09-26 ENCOUNTER — Ambulatory Visit (INDEPENDENT_AMBULATORY_CARE_PROVIDER_SITE_OTHER): Payer: BLUE CROSS/BLUE SHIELD | Admitting: Internal Medicine

## 2016-09-26 VITALS — BP 138/86 | HR 77 | Temp 98.1°F | Ht 66.0 in | Wt 199.9 lb

## 2016-09-26 DIAGNOSIS — Z87891 Personal history of nicotine dependence: Secondary | ICD-10-CM | POA: Diagnosis not present

## 2016-09-26 DIAGNOSIS — J45909 Unspecified asthma, uncomplicated: Secondary | ICD-10-CM | POA: Diagnosis not present

## 2016-09-26 DIAGNOSIS — R030 Elevated blood-pressure reading, without diagnosis of hypertension: Secondary | ICD-10-CM | POA: Diagnosis not present

## 2016-09-26 DIAGNOSIS — J4540 Moderate persistent asthma, uncomplicated: Secondary | ICD-10-CM

## 2016-09-26 MED ORDER — BUDESONIDE-FORMOTEROL FUMARATE 80-4.5 MCG/ACT IN AERO: 2.0000 | INHALATION_SPRAY | Freq: Two times a day (BID) | RESPIRATORY_TRACT | 12 refills | Status: DC

## 2016-09-26 NOTE — Progress Notes (Signed)
   CC: asthma f/up  HPI:  [name] is a 41 y.o. with pmh as listed below is here for asthma f/up  Past Medical History:  Diagnosis Date  . Asthma in adult 10/19/2014  . Bronchitis   . Seasonal allergies    Asthma: Last visit he was using albuterol daily, he was put on step up therapy with advair 29050mcg-50 mcg BID.  However, he stated he never started the advair, he is still using the symbicort which he had from before. After he was shown how to use the inhaler properly, his symptoms are well controlled on symbicort, not requiring any albuterol since last visit.  Elevated BP - has brought in BP reading from home, mostly 120-130 range. Not on meds.   Review of Systems:   Review of Systems  Respiratory: Negative for cough, sputum production and shortness of breath.   Cardiovascular: Negative for chest pain.  Gastrointestinal: Negative for nausea and vomiting.  Neurological: Negative for dizziness and headaches.     Physical Exam:  Vitals:   09/26/16 1529  BP: 138/86  Pulse: 77  Temp: 98.1 F (36.7 C)  TempSrc: Oral  SpO2: 99%  Weight: 199 lb 14.4 oz (90.7 kg)  Height: 5\' 6"  (1.676 m)   Physical Exam  Constitutional: He is oriented to person, place, and time. He appears well-developed and well-nourished. No distress.  HENT:  Head: Normocephalic and atraumatic.  Respiratory: Effort normal and breath sounds normal. No respiratory distress. He has no wheezes. He has no rales.  Musculoskeletal: Normal range of motion. He exhibits no edema or tenderness.  Neurological: He is alert and oriented to person, place, and time.  Skin: He is not diaphoretic.    Assessment & Plan:   See Encounters Tab for problem based charting.  Patient discussed with Dr. Oswaldo DoneVincent

## 2016-09-26 NOTE — Assessment & Plan Note (Signed)
Doing well on symbicort BID and PRN albuterol. Has not required albtuerol since last visit.  Continue current meds. D/c advair as he never started that.

## 2016-09-26 NOTE — Patient Instructions (Signed)
You are doing great!  Continue your current medications.  Follow up in 6 months.

## 2016-09-26 NOTE — Assessment & Plan Note (Signed)
as brought in BP reading from home, mostly 120-130 range. Not on meds.  Vitals:   09/26/16 1529  BP: 138/86  Pulse: 77  Temp: 98.1 F (36.7 C)   I am ok with his BP as it's mostly under 130 at home. Will continue without meds.

## 2016-09-26 NOTE — Addendum Note (Signed)
Addended by: Hyacinth MeekerAHMED, Lyden Redner on: 09/26/2016 04:33 PM   Modules accepted: Orders

## 2016-09-27 NOTE — Progress Notes (Signed)
Internal Medicine Clinic Attending  Case discussed with Dr. Ahmed at the time of the visit.  We reviewed the resident's history and exam and pertinent patient test results.  I agree with the assessment, diagnosis, and plan of care documented in the resident's note. 

## 2016-12-21 DIAGNOSIS — F411 Generalized anxiety disorder: Secondary | ICD-10-CM | POA: Diagnosis not present

## 2017-01-08 DIAGNOSIS — F411 Generalized anxiety disorder: Secondary | ICD-10-CM | POA: Diagnosis not present

## 2017-01-10 DIAGNOSIS — F411 Generalized anxiety disorder: Secondary | ICD-10-CM | POA: Diagnosis not present

## 2017-01-16 DIAGNOSIS — F411 Generalized anxiety disorder: Secondary | ICD-10-CM | POA: Diagnosis not present

## 2017-01-24 DIAGNOSIS — F411 Generalized anxiety disorder: Secondary | ICD-10-CM | POA: Diagnosis not present

## 2017-02-13 ENCOUNTER — Other Ambulatory Visit: Payer: Self-pay | Admitting: Internal Medicine

## 2017-02-13 NOTE — Telephone Encounter (Signed)
Has refills at pharmacy, confirmed, they are filling and will notify him

## 2017-02-13 NOTE — Telephone Encounter (Signed)
Requesting refill on budesonide-formoterol (SYMBICORT) 80-4.5 MCG/ACT inhaler at  [facility] [address] [place], [place] - [phone] AT [facility] OF [facility] & [address] [phone] (Phone) [phone] (Fax)   Has an appointment scheduled to see primary on 02-25-17 at 3:45 pm.

## 2017-02-15 DIAGNOSIS — F411 Generalized anxiety disorder: Secondary | ICD-10-CM | POA: Diagnosis not present

## 2017-02-25 ENCOUNTER — Encounter: Payer: BLUE CROSS/BLUE SHIELD | Admitting: Internal Medicine

## 2017-03-13 ENCOUNTER — Other Ambulatory Visit: Payer: Self-pay

## 2017-03-13 ENCOUNTER — Ambulatory Visit: Payer: BLUE CROSS/BLUE SHIELD | Admitting: Internal Medicine

## 2017-03-13 VITALS — BP 128/83 | HR 79 | Temp 97.9°F | Wt 204.0 lb

## 2017-03-13 DIAGNOSIS — Z7289 Other problems related to lifestyle: Secondary | ICD-10-CM | POA: Diagnosis not present

## 2017-03-13 DIAGNOSIS — Z7951 Long term (current) use of inhaled steroids: Secondary | ICD-10-CM | POA: Diagnosis not present

## 2017-03-13 DIAGNOSIS — J45909 Unspecified asthma, uncomplicated: Secondary | ICD-10-CM

## 2017-03-13 DIAGNOSIS — Z87891 Personal history of nicotine dependence: Secondary | ICD-10-CM

## 2017-03-13 DIAGNOSIS — Z0000 Encounter for general adult medical examination without abnormal findings: Secondary | ICD-10-CM

## 2017-03-13 DIAGNOSIS — R030 Elevated blood-pressure reading, without diagnosis of hypertension: Secondary | ICD-10-CM

## 2017-03-13 DIAGNOSIS — J4540 Moderate persistent asthma, uncomplicated: Secondary | ICD-10-CM

## 2017-03-13 DIAGNOSIS — F1090 Alcohol use, unspecified, uncomplicated: Secondary | ICD-10-CM

## 2017-03-13 DIAGNOSIS — Z789 Other specified health status: Secondary | ICD-10-CM

## 2017-03-13 DIAGNOSIS — Z23 Encounter for immunization: Secondary | ICD-10-CM

## 2017-03-13 NOTE — Assessment & Plan Note (Signed)
-  given flu shot today

## 2017-03-13 NOTE — Assessment & Plan Note (Signed)
Pt continues to drink 2-3 beers a day- is thinking about reducing the usage but does not want pharmacological therapy or other interventions like aa  Plan -counseling given -CTM -follow up in 6 months

## 2017-03-13 NOTE — Progress Notes (Signed)
   CC: asthma, alcohol use disorder, HM HPI: Mr.Cypress Kizzie FantasiaSarabia-Alonso is a 41 y.o. man with PMH noted below here for asthma, alcohol use disorder, HM  Please see Problem List/A&P for the status of the patient's chronic medical problems   Past Medical History:  Diagnosis Date  . Asthma in adult 10/19/2014  . Bronchitis   . Seasonal allergies     Review of Systems:  Constitutional: Negative for fever, chills, weight loss and malaise/fatigue.  HEENT: No headaches, cough Respiratory: Negative for cough, shortness of breath and wheezing.   Physical Exam: Vitals:   03/13/17 1625  BP: 128/83  Pulse: 79  Temp: 97.9 F (36.6 C)  TempSrc: Oral  SpO2: 100%  Weight: 204 lb (92.5 kg)    General: A&O, in NAD Neck: supple, midline trachea CV: RRR, normal s1, s2, no m/r/g Resp: equal and symmetric breath sounds, no wheezing or crackles    Assessment & Plan:   See encounters tab for problem based medical decision making. Patient discussed with Dr. Criselda PeachesMullen

## 2017-03-13 NOTE — Assessment & Plan Note (Signed)
Pt here for follow up of asthma. Says his symptoms are well controlled. He is compliant with his symbicort. Has not had to use the albuterol at all. Denies any dyspnea. Denies smoking.  Plan -continue current regimen of the symbicort daily -albuterol PRN -follow up in 6 months

## 2017-03-13 NOTE — Patient Instructions (Signed)
Thank you for your visit today As we discussed, pl work on reducing your alcohol use Please follow up in 6 months

## 2017-03-13 NOTE — Assessment & Plan Note (Signed)
BP Readings from Last 3 Encounters:  03/13/17 128/83  09/26/16 138/86  08/27/16 136/89   -will continue to monitor at each visit

## 2017-03-14 ENCOUNTER — Encounter: Payer: Self-pay | Admitting: Internal Medicine

## 2017-03-14 NOTE — Progress Notes (Signed)
Internal Medicine Clinic Attending  Case discussed with Dr. Saraiya at the time of the visit.  We reviewed the resident's history and exam and pertinent patient test results.  I agree with the assessment, diagnosis, and plan of care documented in the resident's note.  

## 2017-03-14 NOTE — Addendum Note (Signed)
Addended by: Debe CoderMULLEN, Chestina Komatsu B on: 03/14/2017 09:44 AM   Modules accepted: Level of Service

## 2017-04-22 DIAGNOSIS — F411 Generalized anxiety disorder: Secondary | ICD-10-CM | POA: Diagnosis not present

## 2017-04-23 DIAGNOSIS — F411 Generalized anxiety disorder: Secondary | ICD-10-CM | POA: Diagnosis not present

## 2017-05-15 DIAGNOSIS — F411 Generalized anxiety disorder: Secondary | ICD-10-CM | POA: Diagnosis not present

## 2017-05-16 DIAGNOSIS — F411 Generalized anxiety disorder: Secondary | ICD-10-CM | POA: Diagnosis not present

## 2017-05-21 DIAGNOSIS — F411 Generalized anxiety disorder: Secondary | ICD-10-CM | POA: Diagnosis not present

## 2017-05-22 ENCOUNTER — Ambulatory Visit: Payer: BLUE CROSS/BLUE SHIELD | Admitting: Internal Medicine

## 2017-05-22 VITALS — BP 140/79 | HR 78 | Temp 98.2°F | Wt 207.7 lb

## 2017-05-22 DIAGNOSIS — H1033 Unspecified acute conjunctivitis, bilateral: Secondary | ICD-10-CM

## 2017-05-22 DIAGNOSIS — H11003 Unspecified pterygium of eye, bilateral: Secondary | ICD-10-CM

## 2017-05-22 NOTE — Patient Instructions (Signed)
Mr. Bryan Barr,  [place] ll[place] a la clinica si empiezas a Surveyor, miningtener dolor en tus ojos o si la vision cambia.  Puedes usar gotas en los ojos que se Engineer, productionllaman [employer].

## 2017-05-22 NOTE — Progress Notes (Signed)
   CC: Right eye redness  HPI:  [name] is a 42 y.o. male with history noted below that presents to the acute care clinic for a 4 day history of right red, watery itchy eye.  Same symptoms have started in the left eye today.  Patient denies any trauma to the eye. He denies any eye pain, blurry vision or changes in vision, or use of contact lenses.  Past Medical History:  Diagnosis Date  . Asthma in adult 10/19/2014  . Bronchitis   . Seasonal allergies     Review of Systems:  As noted above per HPI  Physical Exam:  Vitals:   05/22/17 1334  BP: 140/79  Pulse: 78  Temp: 98.2 F (36.8 C)  TempSrc: Oral  Weight: 207 lb 11.2 oz (94.2 kg)   Physical Exam  HENT:  Pterygium on right and left eye on sclera Conjunctiva with redness of right and left eye     Assessment & Plan:   See encounters tab for problem based medical decision making.   Patient seen with Dr. Criselda PeachesMullen

## 2017-05-23 DIAGNOSIS — H1033 Unspecified acute conjunctivitis, bilateral: Secondary | ICD-10-CM | POA: Insufficient documentation

## 2017-05-23 NOTE — Assessment & Plan Note (Signed)
Assessment: Conjunctivitis Likely viral in etiology. Patient states that he feels a grittiness in his right eye that is also itchy and irritated that started 4 days ago. The symptoms are now occurring in the left eye. Patient denies pain to the eyes, changes in vision or use of contacts. The patient was instructed to use eyedrops and to return in one week. Patient stated he did not want to return unless he continued to have issues. I advised that he call the clinic if he develops any eye pain or changes in vision. He stated understanding.  Plan - zaditor drops -return precautions given

## 2017-05-27 DIAGNOSIS — F411 Generalized anxiety disorder: Secondary | ICD-10-CM | POA: Diagnosis not present

## 2017-05-28 DIAGNOSIS — F411 Generalized anxiety disorder: Secondary | ICD-10-CM | POA: Diagnosis not present

## 2017-05-28 NOTE — Progress Notes (Signed)
Internal Medicine Clinic Attending  I saw and evaluated the patient.  I personally confirmed the key portions of the history and exam documented by Dr. Mikey BussingHoffman and I reviewed pertinent patient test results.  The assessment, diagnosis, and plan were formulated together and I agree with the documentation in the resident's note.  He has chronic changes to the left eye which appear to be mildly inflamed with acute presentation issues.  Agree with plan from Dr. Mikey BussingHoffman.

## 2017-06-06 ENCOUNTER — Encounter: Payer: Self-pay | Admitting: *Deleted

## 2017-08-14 ENCOUNTER — Ambulatory Visit: Payer: BLUE CROSS/BLUE SHIELD | Admitting: Podiatry

## 2017-08-14 ENCOUNTER — Encounter: Payer: Self-pay | Admitting: Podiatry

## 2017-08-14 DIAGNOSIS — L600 Ingrowing nail: Secondary | ICD-10-CM

## 2017-08-14 NOTE — Patient Instructions (Signed)
Place 1/4 cup of epsom salts in a quart of warm tap water.  Submerge your foot or feet in the solution and soak for 20 minutes.  This soak should be done twice a day.  Next, remove your foot or feet from solution, blot dry the affected area. Apply ointment and cover if instructed by your doctor.   IF YOUR SKIN BECOMES IRRITATED WHILE USING THESE INSTRUCTIONS, IT IS OKAY TO SWITCH TO  WHITE VINEGAR AND WATER.  As another alternative soak, you may use antibacterial soap and water.  Monitor for any signs/symptoms of infection. Call the office immediately if any occur or go directly to the emergency room. Call with any questions/concerns.  Ingrown Toenail An ingrown toenail occurs when the corner or sides of your toenail grow into the surrounding skin. The big toe is most commonly affected, but it can happen to any of your toes. If your ingrown toenail is not treated, you will be at risk for infection. What are the causes? This condition may be caused by:  Wearing shoes that are too small or tight.  Injury or trauma, such as stubbing your toe or having your toe stepped on.  Improper cutting or care of your toenails.  Being born with (congenital) nail or foot abnormalities, such as having a nail that is too big for your toe.  What increases the risk? Risk factors for an ingrown toenail include:  Age. Your nails tend to thicken as you get older, so ingrown nails are more common in older people.  Diabetes.  Cutting your toenails incorrectly.  Blood circulation problems.  What are the signs or symptoms? Symptoms may include:  Pain, soreness, or tenderness.  Redness.  Swelling.  Hardening of the skin surrounding the toe.  Your ingrown toenail may be infected if there is fluid, pus, or drainage. How is this diagnosed? An ingrown toenail may be diagnosed by medical history and physical exam. If your toenail is infected, your health care provider may test a sample of the  drainage. How is this treated? Treatment depends on the severity of your ingrown toenail. Some ingrown toenails may be treated at home. More severe or infected ingrown toenails may require surgery to remove all or part of the nail. Infected ingrown toenails may also be treated with antibiotic medicines. Follow these instructions at home:  If you were prescribed an antibiotic medicine, finish all of it even if you start to feel better.  Soak your foot in warm soapy water for 20 minutes, 3 times per day or as directed by your health care provider.  Carefully lift the edge of the nail away from the sore skin by wedging a small piece of cotton under the corner of the nail. This may help with the pain. Be careful not to cause more injury to the area.  Wear shoes that fit well. If your ingrown toenail is causing you pain, try wearing sandals, if possible.  Trim your toenails regularly and carefully. Do not cut them in a curved shape. Cut your toenails straight across. This prevents injury to the skin at the corners of the toenail.  Keep your feet clean and dry.  If you are having trouble walking and are given crutches by your health care provider, use them as directed.  Do not pick at your toenail or try to remove it yourself.  Take medicines only as directed by your health care provider.  Keep all follow-up visits as directed by your health care provider. This   is important. Contact a health care provider if:  Your symptoms do not improve with treatment. Get help right away if:  You have red streaks that start at your foot and go up your leg.  You have a fever.  You have increased redness, swelling, or pain.  You have fluid, blood, or pus coming from your toenail. This information is not intended to replace advice given to you by your health care provider. Make sure you discuss any questions you have with your health care provider. Document Released: 04/20/2000 Document Revised:  09/23/2015 Document Reviewed: 03/17/2014 Elsevier Interactive Patient Education  2018 Elsevier Inc.  

## 2017-08-19 NOTE — Progress Notes (Signed)
   Subjective: Patient presents today for evaluation of intermittent pain to the lateral border of the left second toe that began about two months ago. He reports associated redness of the area. Patient is concerned for possible ingrown nail. Applying pressure to the area increases the pain. He has not done anything for treatment. Patient presents today for further treatment and evaluation.  Past Medical History:  Diagnosis Date  . Asthma in adult 10/19/2014  . Bronchitis   . Seasonal allergies     Objective:  General: Well developed, nourished, in no acute distress, alert and oriented x3   Dermatology: Skin is warm, dry and supple bilateral. Lateral border of the left 2nd toe appears to be erythematous with evidence of an ingrowing nail. Pain on palpation noted to the border of the nail fold. The remaining nails appear unremarkable at this time. There are no open sores, lesions.  Vascular: Dorsalis Pedis artery and Posterior Tibial artery pedal pulses palpable. No lower extremity edema noted.   Neruologic: Grossly intact via light touch bilateral.  Musculoskeletal: Muscular strength within normal limits in all groups bilateral. Normal range of motion noted to all pedal and ankle joints.   Assesement: #1 Paronychia with ingrowing nail lateral border left 2nd toe #2 Pain in toe #3 Incurvated nail  Plan of Care:  1. Patient evaluated.  2. Discussed treatment alternatives and plan of care. Explained nail avulsion procedure and post procedure course to patient. 3. Patient opted for permanent partial nail avulsion.  4. Prior to procedure, local anesthesia infiltration utilized using 3 ml of a 50:50 mixture of 2% plain lidocaine and 0.5% plain marcaine in a normal digital block fashion and a betadine prep performed.  5. Partial permanent nail avulsion with chemical matrixectomy performed using 3x30sec applications of phenol followed by alcohol flush.  6. Light dressing applied. 7. Return  to clinic in 2 weeks.   Felecia ShellingBrent M. Braylie Badami, DPM [facility]  Dr. Felecia ShellingBrent M. Kimila Papaleo, DPM    [address]                                        [place], [place] [address]                Office [phone]) [phone]  Fax [phone]

## 2017-08-21 ENCOUNTER — Ambulatory Visit: Payer: BLUE CROSS/BLUE SHIELD | Admitting: Podiatry

## 2017-08-21 DIAGNOSIS — L600 Ingrowing nail: Secondary | ICD-10-CM

## 2017-08-21 MED ORDER — GENTAMICIN SULFATE 0.1 % EX CREA: 1.0000 "application " | TOPICAL_CREAM | Freq: Three times a day (TID) | CUTANEOUS | 1 refills | Status: AC

## 2017-08-27 NOTE — Progress Notes (Signed)
   Subjective: Patient presents today 2 weeks post ingrown nail permanent nail avulsion procedure of the lateral border of the left second toe. Patient states that the toe and nail fold is feeling much better but she is concerned that there is some nail left behind in a corner. Patient is here for further evaluation and treatment.   Past Medical History:  Diagnosis Date  . Asthma in adult 10/19/2014  . Bronchitis   . Seasonal allergies     Objective: Skin is warm, dry and supple. Nail and respective nail fold appears to be healing appropriately. Open wound to the associated nail fold with a granular wound base and moderate amount of fibrotic tissue. Minimal drainage noted. Mild erythema around the periungual region likely due to phenol chemical matricectomy.  Assessment: #1 postop permanent partial nail avulsion lateral border of the left second toe #2 open wound periungual nail fold of respective digit.   Plan of care: #1 patient was evaluated  #2 debridement of open wound was performed to the periungual border of the respective toe using a currette. Antibiotic ointment and Band-Aid was applied. #3 Prescription for Gentamicin cream to be used daily with a Band-Aid.  #4 Patient is to return to clinic on a PRN basis.   Felecia ShellingBrent M. Ruthanna Macchia, DPM [facility]  Dr. Felecia ShellingBrent M. Georgio Hattabaugh, DPM    [address]                                        [place], [place] [address]                Office [phone]  Fax [phone]

## 2017-08-28 ENCOUNTER — Ambulatory Visit: Payer: BLUE CROSS/BLUE SHIELD | Admitting: Podiatry

## 2017-08-30 ENCOUNTER — Encounter: Payer: Self-pay | Admitting: Podiatry

## 2017-08-30 ENCOUNTER — Ambulatory Visit: Payer: BLUE CROSS/BLUE SHIELD | Admitting: Podiatry

## 2017-08-30 DIAGNOSIS — L600 Ingrowing nail: Secondary | ICD-10-CM | POA: Diagnosis not present

## 2017-08-30 DIAGNOSIS — M2042 Other hammer toe(s) (acquired), left foot: Secondary | ICD-10-CM

## 2017-08-30 MED ORDER — DICLOFENAC SODIUM 75 MG PO TBEC: 75.0000 mg | DELAYED_RELEASE_TABLET | Freq: Two times a day (BID) | ORAL | 2 refills | Status: AC

## 2017-08-30 NOTE — Progress Notes (Signed)
Subjective:   Patient ID: Bryan Barr, male   DOB: 42 y.o.   MRN: [id number]   HPI Patient presents with crusted tissue on the second digit left and concerned that there still may be ingrown toenail or infection   ROS      Objective:  Physical Exam  Neurovascular status intact with patient second digit overall healing well with incurvation more plantar with no irritation noted within the nail border itself at the current time and no proximal edema erythema or drainage noted.     Assessment:  Possibility for low-grade paronychia which appears resolved and possibility for digital inflammation of the interphalangeal joint plantar surface     Plan:  Placed on oral anti-inflammatory to reduce inflammation and went ahead today and applied cushion to take pressure off the toe.  If symptoms persist patient will be seen back to recheck

## 2017-10-04 ENCOUNTER — Other Ambulatory Visit: Payer: Self-pay | Admitting: *Deleted

## 2017-10-04 DIAGNOSIS — J4540 Moderate persistent asthma, uncomplicated: Secondary | ICD-10-CM

## 2017-10-04 NOTE — Telephone Encounter (Signed)
Call from pt stating he is completely out of symbicort . Will send to pcp for review.Bryan Spittle Cassady5/31/201911:11 AM  Triage: pt would like a call back once filled

## 2017-10-05 MED ORDER — BUDESONIDE-FORMOTEROL FUMARATE 80-4.5 MCG/ACT IN AERO: 2.0000 | INHALATION_SPRAY | Freq: Two times a day (BID) | RESPIRATORY_TRACT | 12 refills | Status: DC

## 2017-10-05 NOTE — Telephone Encounter (Signed)
Refill approved.

## 2017-10-07 ENCOUNTER — Other Ambulatory Visit: Payer: Self-pay | Admitting: Internal Medicine

## 2017-10-07 ENCOUNTER — Other Ambulatory Visit: Payer: Self-pay | Admitting: *Deleted

## 2017-10-07 MED ORDER — MONTELUKAST SODIUM 10 MG PO TABS: 10.0000 mg | ORAL_TABLET | Freq: Every day | ORAL | 2 refills | Status: AC

## 2017-10-07 NOTE — Telephone Encounter (Signed)
Refill Approved

## 2017-10-07 NOTE — Telephone Encounter (Signed)
Patient requesting refill on singulair 10mg , [facility] [address]

## 2017-10-07 NOTE — Telephone Encounter (Signed)
Request sent 

## 2018-05-22 ENCOUNTER — Encounter: Payer: Self-pay | Admitting: *Deleted

## 2018-06-05 ENCOUNTER — Encounter: Payer: BLUE CROSS/BLUE SHIELD | Admitting: Internal Medicine

## 2018-06-19 ENCOUNTER — Ambulatory Visit: Payer: BLUE CROSS/BLUE SHIELD | Admitting: Internal Medicine

## 2018-06-19 ENCOUNTER — Other Ambulatory Visit: Payer: Self-pay

## 2018-06-19 ENCOUNTER — Encounter: Payer: Self-pay | Admitting: Internal Medicine

## 2018-06-19 VITALS — BP 132/84 | HR 110 | Temp 99.2°F | Ht 66.0 in | Wt 205.7 lb

## 2018-06-19 DIAGNOSIS — R05 Cough: Secondary | ICD-10-CM

## 2018-06-19 DIAGNOSIS — E785 Hyperlipidemia, unspecified: Secondary | ICD-10-CM | POA: Diagnosis not present

## 2018-06-19 DIAGNOSIS — R509 Fever, unspecified: Secondary | ICD-10-CM

## 2018-06-19 DIAGNOSIS — J111 Influenza due to unidentified influenza virus with other respiratory manifestations: Secondary | ICD-10-CM

## 2018-06-19 DIAGNOSIS — Z7951 Long term (current) use of inhaled steroids: Secondary | ICD-10-CM

## 2018-06-19 DIAGNOSIS — J45909 Unspecified asthma, uncomplicated: Secondary | ICD-10-CM

## 2018-06-19 DIAGNOSIS — R093 Abnormal sputum: Secondary | ICD-10-CM

## 2018-06-19 DIAGNOSIS — J4540 Moderate persistent asthma, uncomplicated: Secondary | ICD-10-CM

## 2018-06-19 DIAGNOSIS — R0981 Nasal congestion: Secondary | ICD-10-CM

## 2018-06-19 DIAGNOSIS — M791 Myalgia, unspecified site: Secondary | ICD-10-CM

## 2018-06-19 DIAGNOSIS — Z8639 Personal history of other endocrine, nutritional and metabolic disease: Secondary | ICD-10-CM

## 2018-06-19 DIAGNOSIS — Z79899 Other long term (current) drug therapy: Secondary | ICD-10-CM

## 2018-06-19 MED ORDER — OSELTAMIVIR PHOSPHATE 75 MG PO CAPS: 75.0000 mg | ORAL_CAPSULE | Freq: Two times a day (BID) | ORAL | 0 refills | Status: AC

## 2018-06-19 MED ORDER — BUDESONIDE-FORMOTEROL FUMARATE 80-4.5 MCG/ACT IN AERO
2.0000 | INHALATION_SPRAY | Freq: Two times a day (BID) | RESPIRATORY_TRACT | 12 refills | Status: AC
Start: 2018-06-19 — End: ?

## 2018-06-19 MED ORDER — ALBUTEROL SULFATE HFA 108 (90 BASE) MCG/ACT IN AERS: 2.0000 | INHALATION_SPRAY | Freq: Four times a day (QID) | RESPIRATORY_TRACT | 2 refills | Status: AC | PRN

## 2018-06-19 NOTE — Assessment & Plan Note (Addendum)
Patient has a history of HLD last noted on labs in 2016. Chart review indicated he was started on a medication, but he denies ever being told he was to take a medication for his cholesterol. Will recheck his Lipid profile and treat if indicated. - Lipid Profile  Lab Results Lipid Panel     Component Value Date/Time   CHOL 186 06/19/2018 1503   TRIG 138 06/19/2018 1503   HDL 37 (L) 06/19/2018 1503   CHOLHDL 5.0 06/19/2018 1503   CHOLHDL 4.8 11/24/2014 0923   VLDL 33 11/24/2014 0923   LDLCALC 121 (H) 06/19/2018 1503  - ASCVD risk 2.0% based on these results. No Statin recommended at this time. Letter will be sent to patient.

## 2018-06-19 NOTE — Assessment & Plan Note (Signed)
Patient reports 1-2 days of fevers, chills, nasal congestion, cough intermittently productive of yellow sputum, and body aches. He denies sore throat, sick contacts, or GI symptoms. He states he did have his flu shot this year. He has not measured his temperature at home. He states he has had mild shortness of breath relieved by cold medications and and his twice daily Symbicort inhaler.  On exam he has erythematous and edematous nasal mucosa, Erythematous posterior pharynx. Lungs are clear to ausculatation and TMs are WNL.  Patients presentation consistent with influenza. Will defer flu testing due to his cocnsistent presentation and current shortage of tests. He was offered tamiflu and informed of the benefit of shorten duration symptoms and the possible side effects. He would like a course of Tamiflu. He was advised to stay out of work for the next day or two. Albuterol prescribed PRN given his underlying Asthma. - Tamiflu 75mg  BID x5d - Albuterol PRN - Supportive care - Return precautions given

## 2018-06-19 NOTE — Progress Notes (Signed)
    CC: Influenza, HLD, Asthma  HPI:  [name] is a 43 y.o. M with PMHx listed below presenting for  Influenza, HLD, Asthma. Please see the A&P for the status of the patient's chronic medical problems.  Past Medical History:  Diagnosis Date  . Asthma in adult 10/19/2014  . Bronchitis   . Seasonal allergies    Review of Systems: Performed and all others negative.  Physical Exam:  Vitals:   06/19/18 1412 06/19/18 1415  BP:  132/84  Pulse:  (!) 110  Temp:  99.2 F (37.3 C)  TempSrc:  Oral  SpO2:  96%  Weight: 205 lb 11.2 oz (93.3 kg)   Height: 5\' 6"  (1.676 m)    Physical Exam Constitutional:      General: He is not in acute distress.    Comments: Mildly ill-appearing  HENT:     Right Ear: Tympanic membrane, ear canal and external ear normal.     Left Ear: Tympanic membrane, ear canal and external ear normal.     Nose: Congestion and rhinorrhea present.     Mouth/Throat:     Mouth: Mucous membranes are moist.     Pharynx: Posterior oropharyngeal erythema present. No oropharyngeal exudate.  Eyes:     General:        Right eye: No discharge.        Left eye: No discharge.  Cardiovascular:     Rate and Rhythm: Regular rhythm.     Heart sounds: Normal heart sounds.     Comments: Tachycardic Pulmonary:     Effort: Pulmonary effort is normal.     Breath sounds: Normal breath sounds.  Abdominal:     General: Abdomen is flat. There is no distension.     Palpations: Abdomen is soft.     Tenderness: There is no abdominal tenderness.  Musculoskeletal:        General: No swelling or tenderness.  Skin:    General: Skin is warm and dry.    Assessment & Plan:   See Encounters Tab for problem based charting.  Patient discussed with Dr. Oswaldo Done

## 2018-06-19 NOTE — Assessment & Plan Note (Signed)
Patients asthma symptoms remain well controlled on Symbicort BID. He is requesting refills of this today. He states he does not currently have an albuterol inhaler. - Refill Symbicort 2 puffs BID - Albuterol 2-4 puff q6h PRN

## 2018-06-19 NOTE — Patient Instructions (Signed)
Thank you for allowing [place] to care for you  For your recent symptoms - This is likely due to the flu - You have been prescribed Tamiflu to shorten symptom durations  For your high cholesterol - We are rechecking this today - You will be contacted with the results  You were provided refills of your asthma medications today  Return to clinic if symptoms worsen or fail to improve in the next 7 days  Please follow up in about 1 year or sooner if need

## 2018-06-20 LAB — LIPID PANEL
CHOLESTEROL TOTAL: 186 mg/dL (ref 100–199)
Chol/HDL Ratio: 5 ratio (ref 0.0–5.0)
HDL: 37 mg/dL — AB (ref >39)
LDL Calculated: 121 mg/dL — ABNORMAL HIGH (ref 0–99)
Triglycerides: 138 mg/dL (ref 0–149)
VLDL CHOLESTEROL CAL: 28 mg/dL (ref 5–40)

## 2018-06-20 NOTE — Progress Notes (Signed)
Internal Medicine Clinic Attending  Case discussed with Dr. Melvin  at the time of the visit.  We reviewed the resident's history and exam and pertinent patient test results.  I agree with the assessment, diagnosis, and plan of care documented in the resident's note.  

## 2018-07-22 ENCOUNTER — Encounter: Payer: Self-pay | Admitting: Internal Medicine

## 2018-07-22 NOTE — Progress Notes (Signed)
Letter sent to patient regarding lipid panel results.

## 2019-07-01 ENCOUNTER — Other Ambulatory Visit: Payer: Self-pay | Admitting: Internal Medicine

## 2019-07-01 DIAGNOSIS — J4540 Moderate persistent asthma, uncomplicated: Secondary | ICD-10-CM

## 2019-07-02 NOTE — Telephone Encounter (Signed)
Thank you. Yes, since he has a new PCP, they should be handling his refills. It appears this was an automatic request that just has not updated to his new provider. Refill was refused.

## 2019-09-04 ENCOUNTER — Encounter: Payer: Self-pay | Admitting: *Deleted

## 2023-07-02 ENCOUNTER — Emergency Department (HOSPITAL_COMMUNITY): Payer: BLUE CROSS/BLUE SHIELD

## 2023-07-02 ENCOUNTER — Emergency Department (HOSPITAL_COMMUNITY)
Admission: EM | Admit: 2023-07-02 | Discharge: 2023-07-02 | Disposition: A | Discharge status: Home / Self Care | Payer: BLUE CROSS/BLUE SHIELD | Attending: Emergency Medicine | Admitting: Emergency Medicine

## 2023-07-02 DIAGNOSIS — S6991XA Unspecified injury of right wrist, hand and finger(s), initial encounter: Secondary | ICD-10-CM | POA: Diagnosis present

## 2023-07-02 DIAGNOSIS — W230XXA Caught, crushed, jammed, or pinched between moving objects, initial encounter: Secondary | ICD-10-CM | POA: Diagnosis not present

## 2023-07-02 DIAGNOSIS — R7309 Other abnormal glucose: Secondary | ICD-10-CM | POA: Diagnosis not present

## 2023-07-02 DIAGNOSIS — S61411A Laceration without foreign body of right hand, initial encounter: Secondary | ICD-10-CM | POA: Insufficient documentation

## 2023-07-02 DIAGNOSIS — Z23 Encounter for immunization: Secondary | ICD-10-CM | POA: Diagnosis not present

## 2023-07-02 DIAGNOSIS — Y92008 Other place in unspecified non-institutional (private) residence as the place of occurrence of the external cause: Secondary | ICD-10-CM | POA: Diagnosis not present

## 2023-07-02 MED ORDER — CEFAZOLIN SODIUM 1 G IJ SOLR: 1.0000 g | Freq: Once | INTRAMUSCULAR | Status: DC

## 2023-07-02 MED ORDER — TETANUS-DIPHTH-ACELL PERTUSSIS 5-2.5-18.5 LF-MCG/0.5 IM SUSY
0.5000 mL | PREFILLED_SYRINGE | Freq: Once | INTRAMUSCULAR | Status: AC
  Administered 2023-07-02: 0.5 mL via INTRAMUSCULAR
  Filled 2023-07-02: qty 0.5

## 2023-07-02 MED ORDER — CEFAZOLIN SODIUM-DEXTROSE 1-4 GM/50ML-% IV SOLN
1.0000 g | Freq: Once | INTRAVENOUS | Status: AC
  Administered 2023-07-02: 1 g via INTRAVENOUS
  Filled 2023-07-02 (×2): qty 50

## 2023-07-02 MED ORDER — CEPHALEXIN 500 MG PO CAPS: 500.0000 mg | ORAL_CAPSULE | Freq: Four times a day (QID) | ORAL | 0 refills | Status: AC

## 2023-07-02 MED ORDER — DOXYCYCLINE HYCLATE 100 MG PO CAPS: 100.0000 mg | ORAL_CAPSULE | Freq: Two times a day (BID) | ORAL | 0 refills | Status: AC

## 2023-07-02 MED ORDER — FENTANYL CITRATE PF 50 MCG/ML IJ SOSY
50.0000 ug | PREFILLED_SYRINGE | Freq: Once | INTRAMUSCULAR | Status: AC
  Administered 2023-07-02: 50 ug via INTRAVENOUS
  Filled 2023-07-02: qty 1

## 2023-07-02 MED ADMIN — Lidocaine HCl Local Preservative Free (PF) Inj 1%: 30 mL | NDC 63323049207

## 2023-07-02 MED FILL — Lidocaine HCl Local Preservative Free (PF) Inj 1%: 30.0000 mL | INTRAMUSCULAR | Qty: 30 | Status: AC

## 2023-07-03 ENCOUNTER — Encounter: Payer: Self-pay | Admitting: Internal Medicine
